# Patient Record
Sex: Female | Born: 1980
Health system: Southern US, Community
[De-identification: ages and names within clinical notes are randomized; demographics above are authoritative.]

## PROBLEM LIST (undated history)

## (undated) DIAGNOSIS — N920 Excessive and frequent menstruation with regular cycle: Secondary | ICD-10-CM

## (undated) DIAGNOSIS — R011 Cardiac murmur, unspecified: Secondary | ICD-10-CM

## (undated) DIAGNOSIS — I10 Essential (primary) hypertension: Secondary | ICD-10-CM

## (undated) DIAGNOSIS — N946 Dysmenorrhea, unspecified: Secondary | ICD-10-CM

## (undated) DIAGNOSIS — R894 Abnormal immunological findings in specimens from other organs, systems and tissues: Secondary | ICD-10-CM

## (undated) DIAGNOSIS — B009 Herpesviral infection, unspecified: Secondary | ICD-10-CM

## (undated) DIAGNOSIS — N9089 Other specified noninflammatory disorders of vulva and perineum: Secondary | ICD-10-CM

## (undated) DIAGNOSIS — N760 Acute vaginitis: Secondary | ICD-10-CM

## (undated) DIAGNOSIS — B9689 Other specified bacterial agents as the cause of diseases classified elsewhere: Secondary | ICD-10-CM

## (undated) DIAGNOSIS — N898 Other specified noninflammatory disorders of vagina: Secondary | ICD-10-CM

## (undated) HISTORY — DX: Herpesviral infection, unspecified: B00.9

## (undated) HISTORY — DX: Cardiac murmur, unspecified: R01.1

## (undated) HISTORY — DX: Acute vaginitis: N76.0

## (undated) HISTORY — DX: Excessive and frequent menstruation with regular cycle: N92.0

## (undated) HISTORY — DX: Other specified noninflammatory disorders of vagina: N89.8

## (undated) HISTORY — DX: Dysmenorrhea, unspecified: N94.6

## (undated) HISTORY — DX: Essential (primary) hypertension: I10

## (undated) HISTORY — DX: Other specified noninflammatory disorders of vulva and perineum: N90.89

## (undated) HISTORY — DX: Other specified bacterial agents as the cause of diseases classified elsewhere: B96.89

## (undated) HISTORY — DX: Abnormal immunological findings in specimens from other organs, systems and tissues: R89.4

---

## 2000-08-29 ENCOUNTER — Emergency Department (HOSPITAL_COMMUNITY): Admission: EM | Admit: 2000-08-29 | Discharge: 2000-08-29 | Payer: Self-pay | Admitting: Emergency Medicine

## 2000-08-31 ENCOUNTER — Emergency Department (HOSPITAL_COMMUNITY): Admission: EM | Admit: 2000-08-31 | Discharge: 2000-08-31 | Payer: Self-pay | Admitting: *Deleted

## 2001-03-06 ENCOUNTER — Emergency Department (HOSPITAL_COMMUNITY): Admission: EM | Admit: 2001-03-06 | Discharge: 2001-03-06 | Payer: Self-pay | Admitting: Emergency Medicine

## 2001-10-24 ENCOUNTER — Emergency Department (HOSPITAL_COMMUNITY): Admission: EM | Admit: 2001-10-24 | Discharge: 2001-10-24 | Payer: Self-pay | Admitting: Emergency Medicine

## 2001-10-24 ENCOUNTER — Encounter: Payer: Self-pay | Admitting: Emergency Medicine

## 2001-12-04 ENCOUNTER — Emergency Department (HOSPITAL_COMMUNITY): Admission: EM | Admit: 2001-12-04 | Discharge: 2001-12-04 | Payer: Self-pay | Admitting: Emergency Medicine

## 2002-05-13 ENCOUNTER — Emergency Department (HOSPITAL_COMMUNITY): Admission: EM | Admit: 2002-05-13 | Discharge: 2002-05-13 | Payer: Self-pay | Admitting: Emergency Medicine

## 2002-07-27 ENCOUNTER — Emergency Department (HOSPITAL_COMMUNITY): Admission: EM | Admit: 2002-07-27 | Discharge: 2002-07-27 | Payer: Self-pay | Admitting: Emergency Medicine

## 2004-09-03 ENCOUNTER — Emergency Department (HOSPITAL_COMMUNITY): Admission: EM | Admit: 2004-09-03 | Discharge: 2004-09-03 | Payer: Self-pay | Admitting: Emergency Medicine

## 2005-03-20 ENCOUNTER — Ambulatory Visit (HOSPITAL_COMMUNITY): Admission: RE | Admit: 2005-03-20 | Discharge: 2005-03-20 | Payer: Self-pay | Admitting: Obstetrics and Gynecology

## 2005-03-27 ENCOUNTER — Ambulatory Visit (HOSPITAL_COMMUNITY): Admission: RE | Admit: 2005-03-27 | Discharge: 2005-03-27 | Payer: Self-pay | Admitting: Obstetrics and Gynecology

## 2005-04-01 ENCOUNTER — Ambulatory Visit (HOSPITAL_COMMUNITY): Admission: AD | Admit: 2005-04-01 | Discharge: 2005-04-02 | Payer: Self-pay | Admitting: Obstetrics and Gynecology

## 2005-04-10 ENCOUNTER — Ambulatory Visit (HOSPITAL_COMMUNITY): Admission: RE | Admit: 2005-04-10 | Discharge: 2005-04-10 | Payer: Self-pay | Admitting: Obstetrics and Gynecology

## 2005-04-11 ENCOUNTER — Encounter: Payer: Self-pay | Admitting: Obstetrics & Gynecology

## 2005-04-11 ENCOUNTER — Inpatient Hospital Stay (HOSPITAL_COMMUNITY): Admission: AD | Admit: 2005-04-11 | Discharge: 2005-04-14 | Payer: Self-pay | Admitting: Obstetrics and Gynecology

## 2005-07-12 ENCOUNTER — Emergency Department (HOSPITAL_COMMUNITY): Admission: EM | Admit: 2005-07-12 | Discharge: 2005-07-12 | Payer: Self-pay | Admitting: Emergency Medicine

## 2005-11-26 ENCOUNTER — Ambulatory Visit: Payer: Self-pay | Admitting: Internal Medicine

## 2005-12-03 ENCOUNTER — Ambulatory Visit (HOSPITAL_COMMUNITY): Admission: RE | Admit: 2005-12-03 | Discharge: 2005-12-03 | Payer: Self-pay | Admitting: Internal Medicine

## 2005-12-03 ENCOUNTER — Ambulatory Visit: Payer: Self-pay | Admitting: *Deleted

## 2005-12-10 ENCOUNTER — Ambulatory Visit: Payer: Self-pay | Admitting: Internal Medicine

## 2005-12-10 LAB — CONVERTED CEMR LAB
Calcium: 9.1 mg/dL
Chloride: 106 meq/L
Creatinine, Ser: 0.86 mg/dL

## 2005-12-14 ENCOUNTER — Ambulatory Visit (HOSPITAL_COMMUNITY): Admission: RE | Admit: 2005-12-14 | Discharge: 2005-12-14 | Payer: Self-pay | Admitting: Internal Medicine

## 2006-04-14 ENCOUNTER — Ambulatory Visit: Payer: Self-pay | Admitting: Internal Medicine

## 2006-04-14 ENCOUNTER — Encounter (INDEPENDENT_AMBULATORY_CARE_PROVIDER_SITE_OTHER): Payer: Self-pay | Admitting: *Deleted

## 2006-04-14 ENCOUNTER — Other Ambulatory Visit: Admission: RE | Admit: 2006-04-14 | Discharge: 2006-04-14 | Payer: Self-pay | Admitting: Internal Medicine

## 2006-04-14 LAB — CONVERTED CEMR LAB: Pap Smear: NORMAL

## 2006-05-14 ENCOUNTER — Encounter: Payer: Self-pay | Admitting: Internal Medicine

## 2006-05-14 DIAGNOSIS — N6019 Diffuse cystic mastopathy of unspecified breast: Secondary | ICD-10-CM | POA: Insufficient documentation

## 2006-11-30 ENCOUNTER — Encounter: Payer: Self-pay | Admitting: Internal Medicine

## 2006-11-30 ENCOUNTER — Telehealth (INDEPENDENT_AMBULATORY_CARE_PROVIDER_SITE_OTHER): Payer: Self-pay | Admitting: *Deleted

## 2006-11-30 ENCOUNTER — Ambulatory Visit: Payer: Self-pay | Admitting: Internal Medicine

## 2006-11-30 DIAGNOSIS — R51 Headache: Secondary | ICD-10-CM | POA: Insufficient documentation

## 2006-11-30 DIAGNOSIS — N912 Amenorrhea, unspecified: Secondary | ICD-10-CM | POA: Insufficient documentation

## 2006-11-30 DIAGNOSIS — R030 Elevated blood-pressure reading, without diagnosis of hypertension: Secondary | ICD-10-CM | POA: Insufficient documentation

## 2006-11-30 DIAGNOSIS — R109 Unspecified abdominal pain: Secondary | ICD-10-CM | POA: Insufficient documentation

## 2006-11-30 DIAGNOSIS — R519 Headache, unspecified: Secondary | ICD-10-CM | POA: Insufficient documentation

## 2006-11-30 LAB — CONVERTED CEMR LAB
Beta hcg, urine, semiquantitative: NEGATIVE
Glucose, Urine, Semiquant: NEGATIVE
Specific Gravity, Urine: >=1.03
pH: 6

## 2006-12-01 ENCOUNTER — Telehealth (INDEPENDENT_AMBULATORY_CARE_PROVIDER_SITE_OTHER): Payer: Self-pay | Admitting: *Deleted

## 2006-12-01 LAB — CONVERTED CEMR LAB
Calcium: 8.8 mg/dL (ref 8.4–10.5)
Creatinine, Ser: 0.8 mg/dL (ref 0.40–1.20)

## 2006-12-09 ENCOUNTER — Telehealth (INDEPENDENT_AMBULATORY_CARE_PROVIDER_SITE_OTHER): Payer: Self-pay | Admitting: *Deleted

## 2006-12-09 ENCOUNTER — Ambulatory Visit (HOSPITAL_COMMUNITY): Admission: RE | Admit: 2006-12-09 | Discharge: 2006-12-09 | Payer: Self-pay | Admitting: Internal Medicine

## 2007-03-05 ENCOUNTER — Emergency Department (HOSPITAL_COMMUNITY): Admission: EM | Admit: 2007-03-05 | Discharge: 2007-03-05 | Payer: Self-pay | Admitting: Emergency Medicine

## 2007-03-10 ENCOUNTER — Ambulatory Visit: Payer: Self-pay | Admitting: Internal Medicine

## 2007-06-07 ENCOUNTER — Other Ambulatory Visit: Admission: RE | Admit: 2007-06-07 | Discharge: 2007-06-07 | Payer: Self-pay | Admitting: Internal Medicine

## 2007-06-07 ENCOUNTER — Encounter (INDEPENDENT_AMBULATORY_CARE_PROVIDER_SITE_OTHER): Payer: Self-pay | Admitting: Internal Medicine

## 2007-06-07 ENCOUNTER — Ambulatory Visit: Payer: Self-pay | Admitting: Internal Medicine

## 2007-06-09 ENCOUNTER — Telehealth (INDEPENDENT_AMBULATORY_CARE_PROVIDER_SITE_OTHER): Payer: Self-pay | Admitting: *Deleted

## 2007-07-20 ENCOUNTER — Emergency Department (HOSPITAL_COMMUNITY): Admission: EM | Admit: 2007-07-20 | Discharge: 2007-07-20 | Payer: Self-pay | Admitting: Emergency Medicine

## 2007-12-01 ENCOUNTER — Ambulatory Visit: Payer: Self-pay | Admitting: Internal Medicine

## 2007-12-12 ENCOUNTER — Encounter (INDEPENDENT_AMBULATORY_CARE_PROVIDER_SITE_OTHER): Payer: Self-pay | Admitting: Internal Medicine

## 2008-02-04 ENCOUNTER — Emergency Department (HOSPITAL_COMMUNITY): Admission: EM | Admit: 2008-02-04 | Discharge: 2008-02-04 | Payer: Self-pay | Admitting: Emergency Medicine

## 2008-02-27 ENCOUNTER — Ambulatory Visit: Payer: Self-pay | Admitting: Internal Medicine

## 2008-05-15 ENCOUNTER — Ambulatory Visit: Payer: Self-pay | Admitting: Internal Medicine

## 2008-05-15 ENCOUNTER — Other Ambulatory Visit: Admission: RE | Admit: 2008-05-15 | Discharge: 2008-05-15 | Payer: Self-pay | Admitting: Internal Medicine

## 2008-05-15 ENCOUNTER — Encounter (INDEPENDENT_AMBULATORY_CARE_PROVIDER_SITE_OTHER): Payer: Self-pay | Admitting: Internal Medicine

## 2008-05-15 DIAGNOSIS — J039 Acute tonsillitis, unspecified: Secondary | ICD-10-CM | POA: Insufficient documentation

## 2008-05-17 ENCOUNTER — Encounter (INDEPENDENT_AMBULATORY_CARE_PROVIDER_SITE_OTHER): Payer: Self-pay | Admitting: Internal Medicine

## 2008-12-20 ENCOUNTER — Emergency Department (HOSPITAL_COMMUNITY): Admission: EM | Admit: 2008-12-20 | Discharge: 2008-12-20 | Payer: Self-pay | Admitting: Emergency Medicine

## 2009-05-10 HISTORY — PX: TUBAL LIGATION: SHX77

## 2009-07-22 ENCOUNTER — Encounter (INDEPENDENT_AMBULATORY_CARE_PROVIDER_SITE_OTHER): Payer: Self-pay | Admitting: *Deleted

## 2009-08-07 ENCOUNTER — Emergency Department (HOSPITAL_COMMUNITY): Admission: EM | Admit: 2009-08-07 | Discharge: 2009-08-07 | Payer: Self-pay | Admitting: Emergency Medicine

## 2010-04-17 ENCOUNTER — Emergency Department (HOSPITAL_COMMUNITY)
Admission: EM | Admit: 2010-04-17 | Discharge: 2010-04-17 | Payer: Self-pay | Source: Home / Self Care | Admitting: Emergency Medicine

## 2010-05-10 HISTORY — PX: HERNIA REPAIR: SHX51

## 2010-06-09 NOTE — Letter (Signed)
Summary: 1st Missed Appt.  [facility]  [address]   [place], [place] [address]   Phone: [phone]  Fax: [phone]    July 22, 2009  MRN: [id number]  ALIANA KREISCHER [address] [place], [place]  [address]  Dear Ms. Ernestina Penna,  At [facility], we make every attempt to fit patients into our schedule by reserving several appointment slots for same-day appointments.  However, we cannot always make appointments for patients the same day they are calling.  At the end of the day, we look back at our schedule and find that because of last-minute cancellations and patients not showing up for their scheduled appointments, we have several appointment slots that are left open and could have been used by another person who really needed it.  In the past, you may have been one of the patients who could not get in when you needed to.  But recently, you were one of the patients with an appointment that you didn't show up for or canceled too late for [place] to fill it.  We choose not to charge no-show or last minute cancellation fees to our patients, like many other offices do.  We do not wish to institute that policy and hope we never have to.  However, we kindly request that you assist [place] by providing at least 24 hours' notice if you can't make your appointment.  If no-shows or late cancellations become habitual (i.e. Three or more in a one-year period), we may terminate the physician-patient relationship.    Thank you for your consideration and cooperation.   Altamease Oiler

## 2010-06-11 ENCOUNTER — Other Ambulatory Visit: Payer: Self-pay | Admitting: Obstetrics & Gynecology

## 2010-06-11 ENCOUNTER — Other Ambulatory Visit (HOSPITAL_COMMUNITY)
Admission: RE | Admit: 2010-06-11 | Discharge: 2010-06-11 | Disposition: A | Discharge status: Home / Self Care | Payer: Self-pay | Source: Ambulatory Visit | Attending: Obstetrics and Gynecology | Admitting: Obstetrics and Gynecology

## 2010-06-11 DIAGNOSIS — Z01419 Encounter for gynecological examination (general) (routine) without abnormal findings: Secondary | ICD-10-CM | POA: Insufficient documentation

## 2010-07-31 LAB — RAPID STREP SCREEN (MED CTR MEBANE ONLY): Streptococcus, Group A Screen (Direct): NEGATIVE

## 2010-09-03 ENCOUNTER — Other Ambulatory Visit: Payer: Self-pay | Admitting: General Surgery

## 2010-09-03 ENCOUNTER — Encounter (HOSPITAL_COMMUNITY): Payer: 59

## 2010-09-03 LAB — BASIC METABOLIC PANEL
BUN: 12 mg/dL (ref 6–23)
Calcium: 9.1 mg/dL (ref 8.4–10.5)
GFR calc non Af Amer: >60 mL/min (ref >60)
Potassium: 4 mEq/L (ref 3.5–5.1)
Sodium: 136 mEq/L (ref 135–145)

## 2010-09-03 LAB — CBC
MCV: 84.6 fL (ref 78.0–100.0)
Platelets: 327 10*3/uL (ref 150–400)
RDW: 13.3 % (ref 11.5–15.5)
WBC: 7 10*3/uL (ref 4.0–10.5)

## 2010-09-03 LAB — SURGICAL PCR SCREEN: Staphylococcus aureus: POSITIVE — AB

## 2010-09-08 HISTORY — PX: LAPAROSCOPIC INCISIONAL / UMBILICAL / VENTRAL HERNIA REPAIR: SUR789

## 2010-09-09 ENCOUNTER — Ambulatory Visit (HOSPITAL_COMMUNITY)
Admission: RE | Admit: 2010-09-09 | Discharge: 2010-09-09 | Disposition: A | Discharge status: Home / Self Care | Payer: 59 | Source: Ambulatory Visit | Attending: General Surgery | Admitting: General Surgery

## 2010-09-09 DIAGNOSIS — K439 Ventral hernia without obstruction or gangrene: Secondary | ICD-10-CM | POA: Insufficient documentation

## 2010-09-09 DIAGNOSIS — Z01812 Encounter for preprocedural laboratory examination: Secondary | ICD-10-CM | POA: Insufficient documentation

## 2010-09-09 NOTE — Op Note (Signed)
  [name], Terry NO.:  [id number]  MEDICAL RECORD NO.:  [id number]           PATIENT TYPE:  O  LOCATION:  DAYP                          FACILITY:  [facility]  PHYSICIAN:  Dalia Heading, M.D.  DATE OF BIRTH:  1980/07/18  DATE OF PROCEDURE:  09/09/2010 DATE OF DISCHARGE:                              OPERATIVE REPORT   PREOPERATIVE DIAGNOSIS:  Ventral hernia.  POSTOPERATIVE DIAGNOSIS:  Ventral hernia.  PROCEDURE:  Laparoscopic ventral herniorrhaphy with mesh.  SURGEON:  Dalia Heading, MD  ANESTHESIA:  General endotracheal.  INDICATIONS:  The patient is a 30 year old black female who presents with a ventral hernia.  The risks and benefits of the procedure including bleeding, infection, and the possibility of an open procedure were fully explained to the patient, gave informed consent.  PROCEDURE NOTE:  The patient was placed in the supine position.  After induction of general endotracheal anesthesia, the abdomen was prepped and draped using the usual sterile technique with DuraPrep.  Surgical site confirmation was performed.  A Veress needle was inserted in the epigastric region and confirmation of placement to the abdomen was using the saline drop test.  The abdomen was then insufflated to 15 mmHg pressure.  An 11-mm trocar was then introduced into the left upper quadrant under direct visualization without difficulty.  An additional 11-mm trocar was placed in the right upper quadrant region and 5-mm trocars placed in the right lower quadrant and left lower quadrant regions.  The hernia defect was noted to be approximately 5-6 cm in its greatest diameter in the periumbilical region.  A 15 x 20 cm Ethicon physio mesh was then inserted and tacked in 4 quadrants to the abdominal wall using 2-0 Prolene sutures.  A Protacker was then used as well as an absorbable tacker to complete the fixation of the mesh against the abdominal wall.  The abdomen  was inspected and there was no evidence of perforation or bleeding of the bowel or mesentery.  All air was then evacuated from the abdominal cavity prior to the removal of the trocars.  All wounds were irrigated with normal saline.  All wounds were injected with 0.5% Sensorcaine.  All incisions were closed using staples. Betadine ointment and dry sterile dressings were applied.  All tape and needle counts were correct at the end of the procedure. The patient was extubated in the operating room and went back to recovery room awake in stable condition.  COMPLICATIONS:  None.  SPECIMEN:  None.  BLOOD LOSS:  Minimal.     Dalia Heading, M.D.     MAJ/MEDQ  D:  09/09/2010  T:  09/09/2010  Job:  [id number]  cc:   Lorin Picket A. Gerda Diss, MD Fax: [phone]  Lazaro Arms, M.D. Fax: [phone]  Electronically Signed by Franky Macho M.D. on 09/09/2010 02:29:09 PM

## 2010-09-25 NOTE — Discharge Summary (Signed)
[name], [name]               ACCOUNT NO.:  [id number]   MEDICAL RECORD NO.:  [id number]          PATIENT TYPE:  INP   LOCATION:  A401                          FACILITY:  [facility]   PHYSICIAN:  Tilda Burrow, M.D. DATE OF BIRTH:  1980-06-07   DATE OF ADMISSION:  04/11/2005  DATE OF DISCHARGE:  12/06/2006LH                                 DISCHARGE SUMMARY   ADMISSION DIAGNOSIS:  Pregnancy 40 weeks, 2 days, early labor.   DISCHARGE DIAGNOSES:  1.  Pregnancy 40 weeks, 2 days, delivered.  2.  Cephalopelvic disproportion.   PROCEDURES:  1.  Labor epidural by Dr. Turner Daniels.  2.  Primary low transverse cervical cesarean section by Dr. Turner Daniels.   Both performed on the day of admission, April 11, 2005.   DISCHARGE MEDICATIONS:  1.  Tylox one q.6 h p.r.n. pain.  2.  Motrin 800 mg one p.o. q.8 h routinely x1 week.  3.  Loestrin FE 24 one tablet daily beginning two weeks.  4.  Bottle feeding.   FOLLOWUP:  Follow up in five days with [facility].   HOSPITAL SUMMARY:  A 30 year old gravida 1, para 0 at 40 weeks 2 days, seen  on the morning of April 11, 2005, in early labor with cervix 3 cm dilated  with no prenatal problems noted.  Fifteen prenatal visits documented.  She  had prior medical history positive for HSV 2, suppressed during the third  trimester with no evidence of lesions.  Blood type was A positive,  hemoglobin 13, hematocrit 39, hepatitis HIV, GC/Chlamydia all negative, RPR  nonreactive x2.  Two evaluation.  MSFP at less than 1:500.  Risk of Downs  syndrome was 1:500.  Glucose tolerance test 113 mg/dL.   HOSPITAL COURSE:  The patient was admitted under the care of Dr. Turner Daniels,  managed through the day, allowed to labor.  She received Pitocin to augment  the labor, but had arrested labor in the evening hours of April 11, 2005,  resulting in primary decision for surgery.  She had primary low transverse  cervical cesarean section under epidural with 750  cc blood loss.  She  delivered a healthy female infant, Apgars 9 and 9.  Postpartum course was  uneventful with postop hemoglobin 10, hematocrit 29, compared to admitting  hemoglobin 13.4, hematocrit 40.3.  White count at discharge was 12,400.  The  patient was tolerating a regular diet with passage of flatus.  Healthy  incision with oral contraceptives planned.   ADDENDUM:  The patient plans to pump and bottle feed the baby with breast  milk.  If she does persist in pumping greater than two weeks, she will delay  the initiation of birth control pills.      Tilda Burrow, M.D.  Electronically Signed     JVF/MEDQ  D:  04/14/2005  T:  04/14/2005  Job:  [id number]

## 2010-09-25 NOTE — Procedures (Signed)
[name], [name]               ACCOUNT NO.:  [id number]   MEDICAL RECORD NO.:  [id number]          PATIENT TYPE:  OUT   LOCATION:  RAD                           FACILITY:  [facility]   PHYSICIAN:  Vida Roller, M.D.   DATE OF BIRTH:  [birth year]   DATE OF PROCEDURE:  12/03/2005  DATE OF DISCHARGE:                                  ECHOCARDIOGRAM   Tape number:  [id number].  Tape count:  [id number].   PRIMARY CARE PHYSICIAN:  Erle Crocker, M.D.   This is a 30 year old woman for LV systolic function.   Technical quality study is adequate.   M-MODE TRACINGS:  The aorta 30 is mm.   Left atrium is 32 mm.   The septum is 12 mm.   The posterior wall is 10 mm.   Left ventricular diastolic dimension is 52 mm.   Left ventricular systolic dimension 37 mm.   2-D AND DOPPLER IMAGING:  The left ventricle is normal sized, and there is  preserved LV systolic function with an ejection fraction of 60-65%.  No wall  motion abnormalities were seen.   The right ventricle is top normal in size with normal systolic function.   Both atria appear to be normal size.   The aortic valve is morphologically unremarkable with no stenosis or  regurgitation.   The mitral valve is morphologically unremarkable with no stenosis or  regurgitation.   The tricuspid valve has trivial regurgitation.   There is no pericardial effusion.   The inferior vena cava was not well seen.      Vida Roller, M.D.  Electronically Signed     JH/MEDQ  D:  12/03/2005  T:  12/03/2005  Job:  [id number]

## 2010-09-25 NOTE — Op Note (Signed)
NAME:  [name], [name]               ACCOUNT NO.:  [id number]   MEDICAL RECORD NO.:  [id number]          PATIENT TYPE:  INP   LOCATION:  A401                          FACILITY:  [facility]   PHYSICIAN:  Lazaro Arms, M.D.        DATE OF BIRTH:   DATE OF PROCEDURE:  DATE OF DISCHARGE:                                 OPERATIVE REPORT   PREOPERATIVE DIAGNOSES:  1.  Intrauterine pregnancy at 40 and 2/[redacted] weeks gestation.  2.  Secondary arrest of dilatation of descent.   POSTOPERATIVE DIAGNOSES:  1.  Intrauterine pregnancy at 40 and 2/[redacted] weeks gestation.  2.  Secondary arrest of dilatation of descent.   PROCEDURE:  Primary low transverse cesarean section.   SURGEON:  Lazaro Arms, M.D.   ANESTHESIA:  Epidural.   FINDINGS:  Over a low transverse hysterotomy incision delivered a viable  female.  Apgars 9 and 9. Weight determined in the nursery.  A 3-vessel cord.  Cord blood and cord gas sent.  There was a nuchal cord, a body cord, and a  leg cord.   DESCRIPTION OF PROCEDURE:  The patient had an epidural in place.  It was  dosed up to adequate surgical anesthetic level.  She was prepped and draped  in the usual sterile fashion.  A Pfannenstiel skin incision was made,  carried down sharply through the rectus fascia, scored in the midline, and  extended laterally.  The fascia was taken off the muscles superiorly and  inferiorly without difficulty.  The muscles were divided.  The peritoneal  cavity was entered, back blade was placed.  The vesicouterine serosal flap  was created. A low transverse hysterotomy incision was made.  Over this  incision was delivered a viable female infant with Apgars of 9 and 9 with  the weight to be determined in the nursery.  Again, there was a loose  nuchal, a body cord, and a left leg cord.   The infant was handed to Dr. Milford Cage who was in attendance for routine neonatal  resuscitation.  The placenta was delivered spontaneously.  Cord blood and  cord gas sent.   The uterus exteriorized, closed in two layers.  The first  being a running interlocking layer and the second being an imbricating  layer. There was good hemostasis.  The uterus was replaced in the peritoneal  cavity.  The pelvis was irrigated vigorously.  The muscles and peritoneum  reapproximated loosely.  The fascia closed using #0 Vicryl running.  Subcutaneous tissue was made hemostatic and irrigated.  The skin was closed  using skin staples.  The patient tolerated the procedure well.  She  experienced 750 mL of blood loss; taken to the recovery room in good stable  condition.  All counts were correct x3.      Lazaro Arms, M.D.  Electronically Signed     LHE/MEDQ  D:  04/11/2005  T:  04/12/2005  Job:  [id number]

## 2010-09-25 NOTE — H&P (Signed)
[name], [name]               ACCOUNT NO.:  [id number]   MEDICAL RECORD NO.:  [id number]          PATIENT TYPE:  OIB   LOCATION:  LDR4                          FACILITY:  [facility]   PHYSICIAN:  Tilda Burrow, M.D. DATE OF BIRTH:  1980/06/24   DATE OF ADMISSION:  04/01/2005  DATE OF DISCHARGE:  LH                                HISTORY & PHYSICAL   OBSERVATION NOTE:  Rule out labor.   A 30 year old, primiparous, female at 38-1/[redacted] weeks gestation complaining of  abdominal pain to rule out contractions. There has been no bleeding and no  gush of fluid. Perineal moisture has remained unchanged. The baby has been  active. She had a normal day. She was able to enjoy Thanksgiving day before  arriving here with abdominal discomfort to rule out labor.   Cervical exam shows the cervix by nursing evaluation to be 1 cm long,  posterior with a vertex applied. Cervix was uneffaced. Fetal monitoring  shows excellent reactivity. There are no contractions noted at all.   IMPRESSION:  No evidence of labor. Routine miseries of late pregnancy.   PLAN:  Discharge home. Ambien 10 milligrams at discharge. Tylox 1 tablet  p.o. at discharge. Follow up routine as scheduled.   ADDENDUM:  The patient states that she has discussed Zerita Boers, C.N.N.,  the possibility of induction on April 07, 2005 if undelivered. We will  evaluate cervical status for cervical favorability prior to deciding on date  of possible induction.      Tilda Burrow, M.D.  Electronically Signed     JVF/MEDQ  D:  04/01/2005  T:  04/01/2005  Job:  [id number]

## 2010-09-25 NOTE — Op Note (Signed)
NAMEMILAGROS, MIDDENDORF               ACCOUNT NO.:  [id number]   MEDICAL RECORD NO.:  [id number]          PATIENT TYPE:  INP   LOCATION:  A401                          FACILITY:  [facility]   PHYSICIAN:  Lazaro Arms, M.D.   DATE OF BIRTH:  02/20/1981   DATE OF PROCEDURE:  04/12/2005  DATE OF DISCHARGE:  04/14/2005                                 OPERATIVE REPORT   Rucha is a 30 year old gravida 1 at 40-2/7 weeks who presents to labor and  delivery in early labor. She wanted to try to do without any epidural but  then decided that she needed one. As a result, she was placed in the sitting  position. Betadine prep was used. One percent lidocaine was injected into  the L3-L4 interspace. The Tuohy needle was used and loss-of-resistance  technique  employed, and the epidural space was found with one pass without  difficulty. Ten cc of 0.125% bupivacaine plain was given without ill  effects. An additional 10 cc was given through the catheter which was fed 5  cm into the epidural space to dose up the epidural. The catheter was then  taped down. A continuous infusion of 0.125% bupivacaine with 2 mcg/cc of  fentanyl was begun at 12 cc an hour. The patient is getting good pain  relief. The fetal heart tracing is stable, and the blood pressure is stable.      Lazaro Arms, M.D.  Electronically Signed     LHE/MEDQ  D:  05/26/2005  T:  05/26/2005  Job:  [id number]

## 2010-10-05 NOTE — H&P (Signed)
  [name], Cruz NO.:  [id number]  MEDICAL RECORD NO.:  [id number]           PATIENT TYPE:  LOCATION:                                 FACILITY:  PHYSICIAN:  Dalia Heading, M.D.  DATE OF BIRTH:  January 28, 1981  DATE OF ADMISSION: DATE OF DISCHARGE:  LH                             HISTORY & PHYSICAL   CHIEF COMPLAINT:  Ventral hernia.  HISTORY OF PRESENT ILLNESS:  The patient is a 30 year old black female who is referred for evaluation and treatment of a ventral hernia.  It has been present for some time, but is increasing in size and is causing her discomfort.  It is made worse with straining.  PAST MEDICAL HISTORY:  Unremarkable.  PAST SURGICAL HISTORY:  C-section.  CURRENT MEDICATIONS:  None.  ALLERGIES:  No known drug allergies.  REVIEW OF SYSTEMS:  The patient denies drinking or smoking.  She denies any other cardiopulmonary difficulties or bleeding disorders.  PHYSICAL EXAMINATION:  GENERAL:  The patient is a well-developed, well- nourished black female in no acute distress. HEENT:  Unremarkable. LUNGS:  Clear to auscultation with equal breath sounds bilaterally. HEART:  Regular rate and rhythm without S3, S4, or murmurs. ABDOMEN:  Soft, nontender, and nondistended.  No hepatosplenomegaly or masses noted.  The patient has both a supraumbilical hernia which is 4 cm in size and a small umbilical hernia.  IMPRESSION:  Ventral hernia.  PLAN:  The patient is scheduled for a laparoscopic ventral herniorrhaphy with mesh on Sep 09, 2010.  The risks and benefits of the procedure including bleeding, infection, pain, and the possibility of recurrence of the hernia were fully explained to the patient, gave informed consent.     Dalia Heading, M.D.     MAJ/MEDQ  D:  08/19/2010  T:  08/20/2010  Job:  [id number]  cc:   Lorin Picket A. Gerda Diss, MD Fax: [phone]  Lazaro Arms, M.D. Fax: [phone]  Short-Stay Unit  Electronically Signed by Franky Macho M.D. on 10/05/2010 08:37:47 PM

## 2011-01-19 ENCOUNTER — Other Ambulatory Visit (HOSPITAL_COMMUNITY): Payer: Self-pay | Admitting: General Surgery

## 2011-01-22 ENCOUNTER — Other Ambulatory Visit (HOSPITAL_COMMUNITY): Payer: Self-pay | Admitting: General Surgery

## 2011-01-22 ENCOUNTER — Ambulatory Visit (HOSPITAL_COMMUNITY)
Admission: RE | Admit: 2011-01-22 | Discharge: 2011-01-22 | Disposition: A | Discharge status: Home / Self Care | Payer: 59 | Source: Ambulatory Visit | Attending: General Surgery | Admitting: General Surgery

## 2011-01-22 DIAGNOSIS — Z9889 Other specified postprocedural states: Secondary | ICD-10-CM | POA: Insufficient documentation

## 2011-01-22 DIAGNOSIS — N200 Calculus of kidney: Secondary | ICD-10-CM | POA: Insufficient documentation

## 2011-01-22 DIAGNOSIS — R109 Unspecified abdominal pain: Secondary | ICD-10-CM | POA: Insufficient documentation

## 2011-02-17 LAB — STREP A DNA PROBE

## 2011-02-17 LAB — MONONUCLEOSIS SCREEN: Mono Screen: NEGATIVE

## 2011-05-07 ENCOUNTER — Encounter: Payer: Self-pay | Admitting: Emergency Medicine

## 2011-05-07 ENCOUNTER — Emergency Department (HOSPITAL_COMMUNITY): Payer: 59

## 2011-05-07 ENCOUNTER — Emergency Department (HOSPITAL_COMMUNITY)
Admission: EM | Admit: 2011-05-07 | Discharge: 2011-05-07 | Disposition: A | Discharge status: Home / Self Care | Payer: 59 | Attending: Emergency Medicine | Admitting: Emergency Medicine

## 2011-05-07 DIAGNOSIS — S335XXA Sprain of ligaments of lumbar spine, initial encounter: Secondary | ICD-10-CM | POA: Insufficient documentation

## 2011-05-07 DIAGNOSIS — X58XXXA Exposure to other specified factors, initial encounter: Secondary | ICD-10-CM | POA: Insufficient documentation

## 2011-05-07 DIAGNOSIS — M545 Low back pain, unspecified: Secondary | ICD-10-CM | POA: Insufficient documentation

## 2011-05-07 DIAGNOSIS — S39012A Strain of muscle, fascia and tendon of lower back, initial encounter: Secondary | ICD-10-CM

## 2011-05-07 DIAGNOSIS — M539 Dorsopathy, unspecified: Secondary | ICD-10-CM | POA: Insufficient documentation

## 2011-05-07 LAB — URINALYSIS, ROUTINE W REFLEX MICROSCOPIC
Glucose, UA: NEGATIVE mg/dL
Hgb urine dipstick: NEGATIVE
Specific Gravity, Urine: 1.02 (ref 1.005–1.030)

## 2011-05-07 MED ORDER — CYCLOBENZAPRINE HCL 10 MG PO TABS
10.0000 mg | ORAL_TABLET | Freq: Once | ORAL | Status: AC
  Administered 2011-05-07: 10 mg via ORAL
  Filled 2011-05-07: qty 1

## 2011-05-07 MED ORDER — PREDNISONE 20 MG PO TABS
60.0000 mg | ORAL_TABLET | Freq: Once | ORAL | Status: AC
  Administered 2011-05-07: 60 mg via ORAL
  Filled 2011-05-07: qty 3

## 2011-05-07 MED ORDER — HYDROCODONE-ACETAMINOPHEN 5-325 MG PO TABS: 1.0000 | ORAL_TABLET | Freq: Four times a day (QID) | ORAL | Status: AC | PRN

## 2011-05-07 MED ORDER — PREDNISONE 50 MG PO TABS: ORAL_TABLET | ORAL | Status: AC

## 2011-05-07 MED ORDER — CYCLOBENZAPRINE HCL 10 MG PO TABS: ORAL_TABLET | ORAL | Status: DC

## 2011-05-07 MED ORDER — HYDROCODONE-ACETAMINOPHEN 5-325 MG PO TABS
1.0000 | ORAL_TABLET | Freq: Once | ORAL | Status: AC
  Administered 2011-05-07: 1 via ORAL
  Filled 2011-05-07: qty 1

## 2011-05-07 NOTE — ED Notes (Signed)
Pt c/o low back pain since yesterday.

## 2011-05-07 NOTE — ED Provider Notes (Signed)
History     CSN: [id number]  Arrival date & time 05/07/11  4696   First MD Initiated Contact with Patient 05/07/11 1045      Chief Complaint  Patient presents with  . Back Pain    (Consider location/radiation/quality/duration/timing/severity/associated sxs/prior treatment) HPI Comments: Lower lumbar back pain started yest.  Has taken ibuprofen 800 mg x 2 and hydrocodone x 1 with no relief.  No UTI sxs.  Just finished menstrual cycle.  Patient is a 30 y.o. female presenting with back pain. The history is provided by the patient. No language interpreter was used.  Back Pain  This is a new problem. The current episode started yesterday. The problem occurs constantly. The problem has not changed since onset.The pain is associated with no known injury. The quality of the pain is described as aching. The pain does not radiate. The pain is moderate. The symptoms are aggravated by bending and twisting. The pain is the same all the time. Stiffness is present all day. Pertinent negatives include no fever, no dysuria and no pelvic pain. She has tried NSAIDs for the symptoms. The treatment provided no relief.    History reviewed. No pertinent past medical history.  Past Surgical History  Procedure Date  . Cesarean section   . Hernia repair     No family history on file.  History  Substance Use Topics  . Smoking status: Never Smoker   . Smokeless tobacco: Not on file  . Alcohol Use: No    OB History    Grav Para Term Preterm Abortions TAB SAB Ect Mult Living                  Review of Systems  Constitutional: Negative for fever and chills.  HENT: Negative for hearing loss.   Genitourinary: Negative for dysuria, urgency, frequency, flank pain, vaginal bleeding, vaginal discharge, menstrual problem and pelvic pain.  Musculoskeletal: Positive for back pain.  All other systems reviewed and are negative.    Allergies  Review of patient's allergies indicates no known  allergies.  Home Medications  No current outpatient prescriptions on file.  BP 117/79  Pulse 79  Temp 98.6 F (37 C)  Resp 20  Ht 5\' 6"  (1.676 m)  Wt 193 lb (87.544 kg)  BMI 31.15 kg/m2  SpO2 100%  LMP 04/29/2011  Physical Exam  Nursing note and vitals reviewed. Constitutional: She is oriented to person, place, and time. She appears well-developed and well-nourished. No distress.  HENT:  Head: Normocephalic and atraumatic.  Right Ear: External ear normal.  Left Ear: External ear normal.  Nose: Nose normal.  Mouth/Throat: No oropharyngeal exudate.  Eyes: EOM are normal.  Neck: Normal range of motion.  Cardiovascular: Normal rate, regular rhythm and normal heart sounds.   Pulmonary/Chest: Effort normal and breath sounds normal.  Abdominal: Soft. She exhibits no distension. There is no tenderness.  Musculoskeletal:       Lumbar back: She exhibits decreased range of motion, tenderness, bony tenderness, pain and abnormal pulse. She exhibits no swelling, no edema, no deformity, no laceration and no spasm.       Back:  Neurological: She is alert and oriented to person, place, and time. She displays normal reflexes. No cranial nerve deficit. Coordination normal.  Skin: Skin is warm and dry. She is not diaphoretic.  Psychiatric: She has a normal mood and affect. Judgment normal.    ED Course  Procedures (including critical care time)  Labs Reviewed - No data to  display No results found.   No diagnosis found.    MDM          Worthy Rancher, PA 05/07/11 1324

## 2011-05-08 NOTE — ED Provider Notes (Signed)
Medical screening examination/treatment/procedure(s) were performed by non-physician practitioner and as supervising physician I was immediately available for consultation/collaboration.  Donnetta Hutching, MD 05/08/11 1314

## 2011-06-14 ENCOUNTER — Other Ambulatory Visit: Payer: Self-pay | Admitting: Adult Health

## 2011-06-14 ENCOUNTER — Other Ambulatory Visit (HOSPITAL_COMMUNITY)
Admission: RE | Admit: 2011-06-14 | Discharge: 2011-06-14 | Disposition: A | Discharge status: Home / Self Care | Payer: 59 | Source: Ambulatory Visit | Attending: Obstetrics and Gynecology | Admitting: Obstetrics and Gynecology

## 2011-06-14 DIAGNOSIS — Z01419 Encounter for gynecological examination (general) (routine) without abnormal findings: Secondary | ICD-10-CM | POA: Insufficient documentation

## 2011-10-06 ENCOUNTER — Emergency Department (HOSPITAL_COMMUNITY)
Admission: EM | Admit: 2011-10-06 | Discharge: 2011-10-06 | Disposition: A | Discharge status: Home / Self Care | Payer: 59 | Attending: Emergency Medicine | Admitting: Emergency Medicine

## 2011-10-06 ENCOUNTER — Encounter (HOSPITAL_COMMUNITY): Payer: Self-pay | Admitting: Emergency Medicine

## 2011-10-06 ENCOUNTER — Emergency Department (HOSPITAL_COMMUNITY): Payer: 59

## 2011-10-06 DIAGNOSIS — K6289 Other specified diseases of anus and rectum: Secondary | ICD-10-CM | POA: Insufficient documentation

## 2011-10-06 DIAGNOSIS — R10819 Abdominal tenderness, unspecified site: Secondary | ICD-10-CM | POA: Insufficient documentation

## 2011-10-06 DIAGNOSIS — Z9889 Other specified postprocedural states: Secondary | ICD-10-CM | POA: Insufficient documentation

## 2011-10-06 DIAGNOSIS — R109 Unspecified abdominal pain: Secondary | ICD-10-CM | POA: Insufficient documentation

## 2011-10-06 LAB — URINALYSIS, ROUTINE W REFLEX MICROSCOPIC
Glucose, UA: NEGATIVE mg/dL
Leukocytes, UA: NEGATIVE
pH: 7.5 (ref 5.0–8.0)

## 2011-10-06 LAB — COMPREHENSIVE METABOLIC PANEL
ALT: 24 U/L (ref 0–35)
Albumin: 3.3 g/dL — ABNORMAL LOW (ref 3.5–5.2)
Alkaline Phosphatase: 58 U/L (ref 39–117)
BUN: 15 mg/dL (ref 6–23)
Chloride: 103 mEq/L (ref 96–112)
Glucose, Bld: 96 mg/dL (ref 70–99)
Potassium: 3.8 mEq/L (ref 3.5–5.1)
Total Bilirubin: 0.1 mg/dL — ABNORMAL LOW (ref 0.3–1.2)

## 2011-10-06 LAB — PREGNANCY, URINE: Preg Test, Ur: NEGATIVE

## 2011-10-06 LAB — DIFFERENTIAL
Basophils Relative: 0 % (ref 0–1)
Lymphs Abs: 2.2 10*3/uL (ref 0.7–4.0)
Monocytes Relative: 8 % (ref 3–12)
Neutro Abs: 2.4 10*3/uL (ref 1.7–7.7)
Neutrophils Relative %: 46 % (ref 43–77)

## 2011-10-06 LAB — CBC
Hemoglobin: 11.5 g/dL — ABNORMAL LOW (ref 12.0–15.0)
RBC: 4.22 MIL/uL (ref 3.87–5.11)

## 2011-10-06 LAB — LIPASE, BLOOD: Lipase: 27 U/L (ref 11–59)

## 2011-10-06 MED ORDER — HYDROCODONE-ACETAMINOPHEN 5-325 MG PO TABS: 1.0000 | ORAL_TABLET | ORAL | Status: AC | PRN

## 2011-10-06 MED ORDER — IOHEXOL 300 MG/ML  SOLN
100.0000 mL | Freq: Once | INTRAMUSCULAR | Status: AC | PRN
  Administered 2011-10-06: 100 mL via INTRAVENOUS

## 2011-10-06 MED ORDER — POLYETHYLENE GLYCOL 3350 17 GM/SCOOP PO POWD: 17.0000 g | Freq: Every day | ORAL | Status: AC

## 2011-10-06 NOTE — ED Notes (Signed)
Pt c/o mid abd pain and tenderness since "the beginning of the year."  Reports had surgery for hernia last May.  Denies n/v/d.  LBM was today and was normal per pt. Pt says has been eating and drinking.

## 2011-10-06 NOTE — ED Notes (Signed)
Radiology staff taking pt to ct.

## 2011-10-06 NOTE — ED Notes (Signed)
Pt c/o abd pain x a couple of months. Pt denies any n/v/d. Pt states she had a hernia repair a year ago. Pt also c/o rectal irritation with itching and bleeding.

## 2011-10-06 NOTE — Discharge Instructions (Signed)

## 2011-10-06 NOTE — ED Provider Notes (Signed)
History    Scribed for [employer]. Rubin Payor, MD, the patient was seen in room APA12/APA12. This chart was scribed by Katha Cabal.  CSN: [id number]  Arrival date & time 10/06/11  8295   First MD Initiated Contact with Patient 10/06/11 0815      Chief Complaint  Patient presents with  . Abdominal Pain    (Consider location/radiation/quality/duration/timing/severity/associated sxs/prior Treatment)    Patient was seen at 0817.    Patient is a 31 y.o. female presenting with abdominal pain. The history is provided by the patient. No language interpreter was used.  Abdominal Pain The primary symptoms of the illness include abdominal pain. The primary symptoms of the illness do not include nausea, vomiting or diarrhea. The current episode started more than 2 days ago. The problem has not changed since onset. The abdominal pain is located in the RUQ and LUQ. The abdominal pain does not radiate. The abdominal pain is relieved by nothing. The abdominal pain is exacerbated by eating.  The illness is associated with eating. Significant associated medical issues do not include inflammatory bowel disease.   Pain began "a while ago".  Pain described as a constant "tender" and pressure like pain.  Patient had hernia surgery a year ago. Patient states she feels bloated.  Reports abdomen never went down after surgery.  Pain gets worse after eating certain foods.  Denies fever, nausea, vomiting, diarrhea.  Patient reports rectal pain and blood after wiping for past two weeks.     History reviewed. No pertinent past medical history.  Past Surgical History  Procedure Date  . Cesarean section   . Hernia repair     History reviewed. No pertinent family history.  History  Substance Use Topics  . Smoking status: Current Everyday Smoker    Types: Cigarettes  . Smokeless tobacco: Not on file  . Alcohol Use: Yes     occas    OB History    Grav Para Term Preterm Abortions TAB SAB Ect Mult Living                    Review of Systems  Constitutional: Negative.   HENT: Negative.   Eyes: Negative.   Respiratory: Negative.   Cardiovascular: Negative.   Gastrointestinal: Positive for abdominal pain and rectal pain. Negative for nausea, vomiting and diarrhea.  Genitourinary: Negative.   Musculoskeletal: Negative.   Skin: Negative.   Neurological: Negative.   Hematological: Negative.   Psychiatric/Behavioral: Negative.     Allergies  Review of patient's allergies indicates no known allergies.  Home Medications   Current Outpatient Rx  Name Route Sig Dispense Refill  . ADULT MULTIVITAMIN W/MINERALS CH Oral Take 1 tablet by mouth daily.    Marland Kitchen NORGESTIMATE-ETH ESTRADIOL 0.25-35 MG-MCG PO TABS Oral Take 1 tablet by mouth daily.      BP 143/79  Pulse 66  Temp(Src) 98.7 F (37.1 C) (Oral)  Resp 17  Ht 5\' 5"  (1.651 m)  Wt 189 lb (85.73 kg)  BMI 31.45 kg/m2  SpO2 100%  LMP 09/19/2011  Physical Exam  Nursing note and vitals reviewed. Constitutional: She is oriented to person, place, and time. She appears well-developed and well-nourished. No distress.  HENT:  Head: Normocephalic and atraumatic.  Eyes: EOM are normal.  Neck: Neck supple.  Cardiovascular: Normal rate.   Pulmonary/Chest: Effort normal. No respiratory distress.  Abdominal: She exhibits no pulsatile midline mass and no mass. There is tenderness in the right upper quadrant and left upper quadrant.  There is no guarding. No hernia.       No induration, no erythema, well healed incision from previous laparoscopy surgery,  No palpable hernia  Musculoskeletal: Normal range of motion.  Neurological: She is alert and oriented to person, place, and time.  Skin: Skin is warm and dry.  Psychiatric: She has a normal mood and affect. Her behavior is normal.   rectal exam shows a mild tenderness without swelling or induration.  ED Course  Procedures (including critical care time)   DIAGNOSTIC STUDIES: Oxygen  Saturation is 100% on room air, normal by my interpretation.     COORDINATION OF CARE: 0821-  Physical exam complete.  Will CT abdomen and check blood work.   LABS / RADIOLOGY:   Labs Reviewed  CBC - Abnormal; Notable for the following:    Hemoglobin 11.5 (*)    HCT 35.0 (*)    All other components within normal limits  DIFFERENTIAL  URINALYSIS, ROUTINE W REFLEX MICROSCOPIC  PREGNANCY, URINE  COMPREHENSIVE METABOLIC PANEL  LIPASE, BLOOD   No results found.       MDM  Patient with abdominal pain for a couple months. States she had a previous hernia repair. Mild diffuse tenderness. Laboratory reassuring. CT was done and did not show a cause for pain. We'll have her followup with GI.       MEDICATIONS GIVEN IN THE E.D. Scheduled Meds:   Continuous Infusions:       IMPRESSION: No diagnosis found. Abdominal pain   NEW MEDICATIONS: New Prescriptions   No medications on file      I personally performed the services described in this documentation, which was scribed in my presence. The recorded information has been reviewed and considered.   Scribe       [employer]. Rubin Payor, MD 10/06/11 1020

## 2012-03-06 ENCOUNTER — Encounter (INDEPENDENT_AMBULATORY_CARE_PROVIDER_SITE_OTHER): Payer: Self-pay | Admitting: Surgery

## 2012-03-06 ENCOUNTER — Ambulatory Visit (INDEPENDENT_AMBULATORY_CARE_PROVIDER_SITE_OTHER): Payer: 59 | Admitting: Surgery

## 2012-03-06 VITALS — BP 118/64 | HR 68 | Temp 97.4°F | Resp 16 | Ht 66.0 in | Wt 190.6 lb

## 2012-03-06 DIAGNOSIS — IMO0002 Reserved for concepts with insufficient information to code with codable children: Secondary | ICD-10-CM

## 2012-03-06 DIAGNOSIS — S39011A Strain of muscle, fascia and tendon of abdomen, initial encounter: Secondary | ICD-10-CM | POA: Insufficient documentation

## 2012-03-06 DIAGNOSIS — M6200 Separation of muscle (nontraumatic), unspecified site: Secondary | ICD-10-CM

## 2012-03-06 DIAGNOSIS — Z720 Tobacco use: Secondary | ICD-10-CM

## 2012-03-06 DIAGNOSIS — M6208 Separation of muscle (nontraumatic), other site: Secondary | ICD-10-CM | POA: Insufficient documentation

## 2012-03-06 DIAGNOSIS — F17200 Nicotine dependence, unspecified, uncomplicated: Secondary | ICD-10-CM

## 2012-03-06 NOTE — Progress Notes (Signed)
Subjective:     Patient ID: [name], female   DOB: 08-Aug-1980, 31 y.o.   MRN: [id number]  HPI  [name]  [birth year] [id number]  Patient Care Team: Merlyn Albert, MD as PCP - General (Family Medicine)  This patient is a 31 y.o.female who presents today for surgical evaluation at the request of Dr. Looking at [facility] family medicine.   Reason for evaluation: Abdominal pain.  Question of recurrent hernia.  Patient is a pleasant healthy female.  She comes today with her young son.  Smokes a few cigarettes a day.  Had repair of a super umbilical ventral hernia with mesh by Dr. Franky Macho last year.  Initially did well.  No problems with wound infections.   Began to have intermittent soreness.  Has become more pronounced this year, especially after an episode of nausea and vomiting with a flu last winter.  Has tried ibuprofen and Aleve without much help.  She describes it as a diffuse upper abdominal soreness.  Worse with bending down or turning or twisting.  Claims to have a bowel movement every day.  Occasionally feels bloated.  No episodes of severe constipation or diarrhea.  And walk 30 minutes without difficulty.  No fevers or chills.  Tolerates all foods fine.  No problems with spicy or fatty foods.  No heartburn or reflux.  No history of pancreatitis or hepatitis.  No personal nor family history of GI/colon cancer, inflammatory bowel disease, irritable bowel syndrome, allergy such as Celiac Sprue, dietary/dairy problems, colitis, ulcers nor gastritis.  No recent sick contacts/gastroenteritis.  No travel outside the country.  No changes in diet.    Patient Active Problem List  Diagnosis  . TONSILLITIS  . FIBROCYSTIC BREAST DISEASE  . AMENORRHEA  . SYMPTOM, HEADACHE  . PELVIC  PAIN  . ELEVATED BLOOD PRESSURE  . Abdominal muscle strain  . Diastasis recti  . Tobacco abuse    Past Medical History  Diagnosis Date  . Heart murmur     Past Surgical History    Procedure Date  . Cesarean section     x2  . Laparoscopic incisional / umbilical / ventral hernia repair 09/2010    Dr. Lovell Sheehan    History   Social History  . Marital Status: Married    Spouse Name: N/A    Number of Children: N/A  . Years of Education: N/A   Occupational History  . Not on file.   Social History Main Topics  . Smoking status: Current Every Day Smoker    Types: Cigarettes  . Smokeless tobacco: Never Used  . Alcohol Use: Yes     occas  . Drug Use: No  . Sexually Active: Not on file   Other Topics Concern  . Not on file   Social History Narrative  . No narrative on file    Family History  Problem Relation Age of Onset  . Cancer Paternal Uncle     unknown  . Cancer Paternal Grandmother     unknown    Current Outpatient Prescriptions  Medication Sig Dispense Refill  . Multiple Vitamin (MULITIVITAMIN WITH MINERALS) TABS Take 1 tablet by mouth daily.      . norgestimate-ethinyl estradiol (ORTHO-CYCLEN,SPRINTEC,PREVIFEM) 0.25-35 MG-MCG tablet Take 1 tablet by mouth daily.         No Known Allergies  BP 118/64  Pulse 68  Temp 97.4 F (36.3 C) (Temporal)  Resp 16  Ht 5\' 6"  (1.676 m)  Wt 190 lb 9.6 oz (  86.456 kg)  BMI 30.76 kg/m2  LMP 02/29/2012  No results found.   Review of Systems  Constitutional: Negative for fever, chills, diaphoresis, appetite change and fatigue.  HENT: Negative for ear pain, sore throat, trouble swallowing, neck pain and ear discharge.   Eyes: Negative for photophobia, discharge and visual disturbance.  Respiratory: Negative for cough, choking, chest tightness, shortness of breath, wheezing and stridor.   Cardiovascular: Negative for chest pain, palpitations and leg swelling.  Gastrointestinal: Positive for abdominal pain and abdominal distention. Negative for nausea, vomiting, diarrhea, constipation, blood in stool, anal bleeding and rectal pain.  Genitourinary: Negative for dysuria, frequency and difficulty  urinating.  Musculoskeletal: Negative for back pain, joint swelling, arthralgias and gait problem.  Skin: Negative for color change, pallor and rash.  Neurological: Negative for dizziness, speech difficulty, weakness and numbness.  Hematological: Negative for adenopathy.  Psychiatric/Behavioral: Negative for confusion and agitation. The patient is not nervous/anxious.        Objective:   Physical Exam  Constitutional: She is oriented to person, place, and time. She appears well-developed and well-nourished. No distress.  HENT:  Head: Normocephalic.  Mouth/Throat: Oropharynx is clear and moist. No oropharyngeal exudate.  Eyes: Conjunctivae normal and EOM are normal. Pupils are equal, round, and reactive to light. No scleral icterus.  Neck: Normal range of motion. Neck supple. No tracheal deviation present.  Cardiovascular: Normal rate, regular rhythm and intact distal pulses.   Pulmonary/Chest: Effort normal and breath sounds normal. No respiratory distress. She exhibits no tenderness.  Abdominal: Soft. Bowel sounds are normal. She exhibits no distension, no pulsatile liver and no mass. There is tenderness in the epigastric area. There is no rigidity, no rebound, no guarding, no CVA tenderness, no tenderness at McBurney's point and negative Murphy's sign. No hernia. Hernia confirmed negative in the ventral area, confirmed negative in the right inguinal area and confirmed negative in the left inguinal area.    Genitourinary: No vaginal discharge found.  Musculoskeletal: Normal range of motion. She exhibits no tenderness.  Lymphadenopathy:    She has no cervical adenopathy.       Right: No inguinal adenopathy present.       Left: No inguinal adenopathy present.  Neurological: She is alert and oriented to person, place, and time. No cranial nerve deficit. She exhibits normal muscle tone. Coordination normal.  Skin: Skin is warm and dry. No rash noted. She is not diaphoretic. No erythema.    Psychiatric: She has a normal mood and affect. Her behavior is normal. Judgment and thought content normal.       Assessment:     Mild diffuse upper abdominal pain in the setting of ventral hernia  With diastases.  No evidence of hernia recurrence.  No evidence of bowel obstruction.  Story not consistent with reflux or biliary colic.  I suspect she has some constipation as well given the moderate stool burden on CT scan  Probable musculoskeletal strain.    Plan:     I discussed with her the importance of trying to calm the irritated muscle and scar tissue.  Stop smoking.  Do heat and high-dose anti-inflammatories round-the-clock.  Do this for a month.  It should help it resolve.  If it does not help, we will add gabapentin or possibly amitriptyline to help.  There is no focal area to inject.  She seemed reassured that there is no recurrent hernia.  I explained the mild bulging that she sees is related to the diastases recti and  some obesity as well.    Increase activity as tolerated to regular activity.  Do not push through pain.  Diet as tolerated. Bowel regimen to avoid problems.  She has a moderate stool burden on the right side and I wonder if that contributes to her bloating.  Return to clinic p.r.n.  Call us back for followup to make sure she is improving.  Instructions discussed.  Followup with primary care physician for other health issues as would normally be done.  Questions answered.  The patient expressed understanding and appreciation

## 2012-03-06 NOTE — Patient Instructions (Addendum)
Managing Pain  Pain after surgery or related to activity is often due to strain/injury to muscle, tendon, nerves and/or incisions.  This pain is usually short-term and will improve in a few months.   Many people find it helpful to do the following things TOGETHER to help speed the process of healing and to get back to regular activity more quickly:  1. Avoid heavy physical activity a.  no lifting greater than 20 pounds b. Do not "push through" the pain.  Listen to your body and avoid positions and maneuvers than reproduce the pain c. Walking is okay as tolerated, but go slowly and stop when getting sore.  d. Remember: If it hurts to do it, then don't do it! 2. Take Anti-inflammatory medication  a. Take with food/snack around the clock for 1-2 weeks i. This helps the muscle and nerve tissues become less irritable and calm down faster b. Choose ONE of the following over-the-counter medications: i. Naproxen 220mg  tabs (ex. Aleve) 1-2 pills twice a day  ii. Ibuprofen 200mg  tabs (ex. Advil, Motrin) 3-4 pills with every meal and just before bedtime iii. Acetaminophen 500mg  tabs (Tylenol) 1-2 pills with every meal and just before bedtime 3. Use a Heating pad or Ice/Cold Pack a. 4-6 times a day b. May use warm bath/hottub  or showers 4. Try Gentle Massage and/or Stretching  a. at the area of pain many times a day b. stop if you feel pain - do not overdo it  Try these steps together to help you body heal faster and avoid making things get worse.  Doing just one of these things may not be enough.    If you are not getting better after two weeks or are noticing you are getting worse, contact our office for further advice; we may need to re-evaluate you & see what other things we can do to help.  Muscle Strain A muscle strain (pulled muscle) happens when a muscle is over-stretched. Recovery usually takes 5 to 6 weeks.  HOME CARE   Put ice on the injured area.  Put ice in a plastic  bag.  Place a towel between your skin and the bag.  Leave the ice on for 15 to 20 minutes at a time, every hour for the first 2 days.  Do not use the muscle for several days or until your doctor says you can. Do not use the muscle if you have pain.  Wrap the injured area with an elastic bandage for comfort. Do not put it on too tightly.  Only take medicine as told by your doctor.  Warm up before exercise. This helps prevent muscle strains. GET HELP RIGHT AWAY IF:  There is increased pain or puffiness (swelling) in the affected area. MAKE SURE YOU:   Understand these instructions.  Will watch your condition.  Will get help right away if you are not doing well or get worse. Document Released: 02/03/2008 Document Revised: 07/19/2011 Document Reviewed: 02/03/2008 [facility] Patient Information 2013 [place], [place].  Managing Pain  Pain after surgery or related to activity is often due to strain/injury to muscle, tendon, nerves and/or incisions.  This pain is usually short-term and will improve in a few months.   Many people find it helpful to do the following things TOGETHER to help speed the process of healing and to get back to regular activity more quickly:  5. Avoid heavy physical activity a.  no lifting greater than 20 pounds b. Do not "push through" the pain.  Listen to your body and avoid positions and maneuvers than reproduce the pain c. Walking is okay as tolerated, but go slowly and stop when getting sore.  d. Remember: If it hurts to do it, then don't do it! 6. Take Anti-inflammatory medication  a. Take with food/snack around the clock for 1-2 weeks i. This helps the muscle and nerve tissues become less irritable and calm down faster b. Choose ONE of the following over-the-counter medications: i. Naproxen 220mg  tabs (ex. Aleve) 2 pills twice a day  ii. Ibuprofen 200mg  tabs (ex. Advil, Motrin) 4 pills with every meal and just before bedtime 7. Use a Heating pad or  Ice/Cold Pack a. 4-6 times a day b. May use warm bath/hottub  or showers 8. Try Gentle Massage and/or Stretching  a. at the area of pain many times a day b. stop if you feel pain - do not overdo it  Try these steps together to help you body heal faster and avoid making things get worse.  Doing just one of these things will not be enough.    If you are not getting better after one month or are noticing you are getting worse, contact our office for further advice; we may need to re-evaluate you & see what other things we can do to help.  GETTING TO GOOD BOWEL HEALTH. Irregular bowel habits such as constipation and diarrhea can lead to many problems over time.  Having one soft bowel movement a day is the most important way to prevent further problems.  The anorectal canal is designed to handle stretching and feces to safely manage our ability to get rid of solid waste (feces, poop, stool) out of our body.  BUT, hard constipated stools can act like ripping concrete bricks and diarrhea can be a burning fire to this very sensitive area of our body, causing inflamed hemorrhoids, anal fissures, increasing risk is perirectal abscesses, abdominal pain/bloating, an making irritable bowel worse.     The goal: ONE SOFT BOWEL MOVEMENT A DAY!  To have soft, regular bowel movements:    Drink at least 8 tall glasses of water a day.     Take plenty of fiber.  Fiber is the undigested part of plant food that passes into the colon, acting s "natures broom" to encourage bowel motility and movement.  Fiber can absorb and hold large amounts of water. This results in a larger, bulkier stool, which is soft and easier to pass. Work gradually over several weeks up to 6 servings a day of fiber (25g a day even more if needed) in the form of: o Vegetables -- Root (potatoes, carrots, turnips), leafy green (lettuce, salad greens, celery, spinach), or cooked high residue (cabbage, broccoli, etc) o Fruit -- Fresh (unpeeled skin &  pulp), Dried (prunes, apricots, cherries, etc ),  or stewed ( applesauce)  o Whole grain breads, pasta, etc (whole wheat)  o Bran cereals    Bulking Agents -- This type of water-retaining fiber generally is easily obtained each day by one of the following:  o Psyllium bran -- The psyllium plant is remarkable because its ground seeds can retain so much water. This product is available as Metamucil, Konsyl, Effersyllium, Per Diem Fiber, or the less expensive generic preparation in drug and health food stores. Although labeled a laxative, it really is not a laxative.  o Methylcellulose -- This is another fiber derived from wood which also retains water. It is available as Citrucel. o Polyethylene Glycol - and "  artificial" fiber commonly called Miralax or Glycolax.  It is helpful for people with gassy or bloated feelings with regular fiber o Flax Seed - a less gassy fiber than psyllium   No reading or other relaxing activity while on the toilet. If bowel movements take longer than 5 minutes, you are too constipated   AVOID CONSTIPATION.  High fiber and water intake usually takes care of this.  Sometimes a laxative is needed to stimulate more frequent bowel movements, but    Laxatives are not a good long-term solution as it can wear the colon out. o Osmotics (Milk of Magnesia, Fleets phosphosoda, Magnesium citrate, MiraLax, GoLytely) are safer than  o Stimulants (Senokot, Castor Oil, Dulcolax, Ex Lax)    o Do not take laxatives for more than 7days in a row.    IF SEVERELY CONSTIPATED, try a Bowel Retraining Program: o Do not use laxatives.  o Eat a diet high in roughage, such as bran cereals and leafy vegetables.  o Drink six (6) ounces of prune or apricot juice each morning.  o Eat two (2) large servings of stewed fruit each day.  o Take one (1) heaping tablespoon of a psyllium-based bulking agent twice a day. Use sugar-free sweetener when possible to avoid excessive calories.  o Eat a normal  breakfast.  o Set aside 15 minutes after breakfast to sit on the toilet, but do not strain to have a bowel movement.  o If you do not have a bowel movement by the third day, use an enema and repeat the above steps.    Controlling diarrhea o Switch to liquids and simpler foods for a few days to avoid stressing your intestines further. o Avoid dairy products (especially milk & ice cream) for a short time.  The intestines often can lose the ability to digest lactose when stressed. o Avoid foods that cause gassiness or bloating.  Typical foods include beans and other legumes, cabbage, broccoli, and dairy foods.  Every person has some sensitivity to other foods, so listen to our body and avoid those foods that trigger problems for you. o Adding fiber (Citrucel, Metamucil, psyllium, Miralax) gradually can help thicken stools by absorbing excess fluid and retrain the intestines to act more normally.  Slowly increase the dose over a few weeks.  Too much fiber too soon can backfire and cause cramping & bloating. o Probiotics (such as active yogurt, Align, etc) may help repopulate the intestines and colon with normal bacteria and calm down a sensitive digestive tract.  Most studies show it to be of mild help, though, and such products can be costly. o Medicines:   Bismuth subsalicylate (ex. Kayopectate, Pepto Bismol) every 30 minutes for up to 6 doses can help control diarrhea.  Avoid if pregnant.   Loperamide (Immodium) can slow down diarrhea.  Start with two tablets (4mg  total) first and then try one tablet every 6 hours.  Avoid if you are having fevers or severe pain.  If you are not better or start feeling worse, stop all medicines and call your doctor for advice o Call your doctor if you are getting worse or not better.  Sometimes further testing (cultures, endoscopy, X-ray studies, bloodwork, etc) may be needed to help diagnose and treat the cause of the diarrhea. o   Smoking Cessation, Tips for  Success YOU CAN QUIT SMOKING If you are ready to quit smoking, congratulations! You have chosen to help yourself be healthier. Cigarettes bring nicotine, tar, carbon monoxide, and other  irritants into your body. Your lungs, heart, and blood vessels will be able to work better without these poisons. There are many different ways to quit smoking. Nicotine gum, nicotine patches, a nicotine inhaler, or nicotine nasal spray can help with physical craving. Hypnosis, support groups, and medicines help break the habit of smoking. Here are some tips to help you quit for good.  Throw away all cigarettes.  Clean and remove all ashtrays from your home, work, and car.  On a card, write down your reasons for quitting. Carry the card with you and read it when you get the urge to smoke.  Cleanse your body of nicotine. Drink enough water and fluids to keep your urine clear or pale yellow. Do this after quitting to flush the nicotine from your body.  Learn to predict your moods. Do not let a bad situation be your excuse to have a cigarette. Some situations in your life might tempt you into wanting a cigarette.  Never have "just one" cigarette. It leads to wanting another and another. Remind yourself of your decision to quit.  Change habits associated with smoking. If you smoked while driving or when feeling stressed, try other activities to replace smoking. Stand up when drinking your coffee. Brush your teeth after eating. Sit in a different chair when you read the paper. Avoid alcohol while trying to quit, and try to drink fewer caffeinated beverages. Alcohol and caffeine may urge you to smoke.  Avoid foods and drinks that can trigger a desire to smoke, such as sugary or spicy foods and alcohol.  Ask people who smoke not to smoke around you.  Have something planned to do right after eating or having a cup of coffee. Take a walk or exercise to perk you up. This will help to keep you from overeating.  Try a  relaxation exercise to calm you down and decrease your stress. Remember, you may be tense and nervous for the first 2 weeks after you quit, but this will pass.  Find new activities to keep your hands busy. Play with a pen, coin, or rubber band. Doodle or draw things on paper.  Brush your teeth right after eating. This will help cut down on the craving for the taste of tobacco after meals. You can try mouthwash, too.  Use oral substitutes, such as lemon drops, carrots, a cinnamon stick, or chewing gum, in place of cigarettes. Keep them handy so they are available when you have the urge to smoke.  When you have the urge to smoke, try deep breathing.  Designate your home as a nonsmoking area.  If you are a heavy smoker, ask your caregiver about a prescription for nicotine chewing gum. It can ease your withdrawal from nicotine.  Reward yourself. Set aside the cigarette money you save and buy yourself something nice.  Look for support from others. Join a support group or smoking cessation program. Ask someone at home or at work to help you with your plan to quit smoking.  Always ask yourself, "Do I need this cigarette or is this just a reflex?" Tell yourself, "Today, I choose not to smoke," or "I do not want to smoke." You are reminding yourself of your decision to quit, even if you do smoke a cigarette. HOW WILL I FEEL WHEN I QUIT SMOKING?  The benefits of not smoking start within days of quitting.  You may have symptoms of withdrawal because your body is used to nicotine (the addictive substance in cigarettes).  You may crave cigarettes, be irritable, feel very hungry, cough often, get headaches, or have difficulty concentrating.  The withdrawal symptoms are only temporary. They are strongest when you first quit but will go away within 10 to 14 days.  When withdrawal symptoms occur, stay in control. Think about your reasons for quitting. Remind yourself that these are signs that your body is  healing and getting used to being without cigarettes.  Remember that withdrawal symptoms are easier to treat than the major diseases that smoking can cause.  Even after the withdrawal is over, expect periodic urges to smoke. However, these cravings are generally short-lived and will go away whether you smoke or not. Do not smoke!  If you relapse and smoke again, do not lose hope. Most smokers quit 3 times before they are successful.  If you relapse, do not give up! Plan ahead and think about what you will do the next time you get the urge to smoke. LIFE AS A NONSMOKER: MAKE IT FOR A MONTH, MAKE IT FOR LIFE Day 1: Hang this page where you will see it every day. Day 2: Get rid of all ashtrays, matches, and lighters. Day 3: Drink water. Breathe deeply between sips. Day 4: Avoid places with smoke-filled air, such as bars, clubs, or the smoking section of restaurants. Day 5: Keep track of how much money you save by not smoking. Day 6: Avoid boredom. Keep a good book with you or go to the movies. Day 7: Reward yourself! One week without smoking! Day 8: Make a dental appointment to get your teeth cleaned. Day 9: Decide how you will turn down a cigarette before it is offered to you. Day 10: Review your reasons for quitting. Day 11: Distract yourself. Stay active to keep your mind off smoking and to relieve tension. Take a walk, exercise, read a book, do a crossword puzzle, or try a new hobby. Day 12: Exercise. Get off the bus before your stop or use stairs instead of escalators. Day 13: Call on friends for support and encouragement. Day 14: Reward yourself! Two weeks without smoking! Day 15: Practice deep breathing exercises. Day 16: Bet a friend that you can stay a nonsmoker. Day 17: Ask to sit in nonsmoking sections of restaurants. Day 18: Hang up "No Smoking" signs. Day 19: Think of yourself as a nonsmoker. Day 20: Each morning, tell yourself you will not smoke. Day 21: Reward yourself!  Three weeks without smoking! Day 22: Think of smoking in negative ways. Remember how it stains your teeth, gives you bad breath, and leaves you short of breath. Day 23: Eat a nutritious breakfast. Day 24:Do not relive your days as a smoker. Day 25: Hold a pencil in your hand when talking on the telephone. Day 26: Tell all your friends you do not smoke. Day 27: Think about how much better food tastes. Day 28: Remember, one cigarette is one too many. Day 29: Take up a hobby that will keep your hands busy. Day 30: Congratulations! One month without smoking! Give yourself a big reward. Your caregiver can direct you to community resources or hospitals for support, which may include:  Group support.  Education.  Hypnosis.  Subliminal therapy. Document Released: 01/23/2004 Document Revised: 07/19/2011 Document Reviewed: 02/10/2009 [facility] Patient Information 2013 [place], [place].

## 2012-06-28 ENCOUNTER — Other Ambulatory Visit: Payer: Self-pay | Admitting: Adult Health

## 2012-06-28 ENCOUNTER — Other Ambulatory Visit (HOSPITAL_COMMUNITY)
Admission: RE | Admit: 2012-06-28 | Discharge: 2012-06-28 | Disposition: A | Discharge status: Home / Self Care | Payer: 59 | Source: Ambulatory Visit | Attending: Obstetrics and Gynecology | Admitting: Obstetrics and Gynecology

## 2012-06-28 DIAGNOSIS — Z01419 Encounter for gynecological examination (general) (routine) without abnormal findings: Secondary | ICD-10-CM | POA: Insufficient documentation

## 2012-06-28 DIAGNOSIS — Z1151 Encounter for screening for human papillomavirus (HPV): Secondary | ICD-10-CM | POA: Insufficient documentation

## 2012-08-08 ENCOUNTER — Encounter: Payer: Self-pay | Admitting: *Deleted

## 2012-08-09 ENCOUNTER — Ambulatory Visit (INDEPENDENT_AMBULATORY_CARE_PROVIDER_SITE_OTHER): Payer: 59 | Admitting: Family Medicine

## 2012-08-09 ENCOUNTER — Encounter: Payer: Self-pay | Admitting: Family Medicine

## 2012-08-09 VITALS — BP 136/94 | HR 80 | Wt 197.2 lb

## 2012-08-09 DIAGNOSIS — M545 Low back pain: Secondary | ICD-10-CM

## 2012-08-09 MED ORDER — NABUMETONE 750 MG PO TABS: 750.0000 mg | ORAL_TABLET | Freq: Two times a day (BID) | ORAL | Status: DC

## 2012-08-09 MED ORDER — CHLORZOXAZONE 500 MG PO TABS: 500.0000 mg | ORAL_TABLET | Freq: Four times a day (QID) | ORAL | Status: DC | PRN

## 2012-08-09 NOTE — Progress Notes (Signed)
  Subjective:    Patient ID: [name], female    DOB: Mar 26, 1981, 32 y.o.   MRN: [id number]  Back Pain This is a new problem. The current episode started in the past 7 days. The problem occurs constantly. The problem has been gradually worsening since onset. The quality of the pain is described as aching. The pain is at a severity of 5/10. The pain is severe. The pain is worse during the day. The symptoms are aggravated by bending and coughing. Stiffness is present in the morning. Associated symptoms include leg pain. Pertinent negatives include no abdominal pain, headaches or numbness. She has tried NSAIDs for the symptoms. The treatment provided moderate relief.   Patient notes intermittent odor to urine. No dysuria. No fever no chills.   Review of Systems  Gastrointestinal: Negative for abdominal pain.  Musculoskeletal: Positive for back pain.  Neurological: Negative for numbness and headaches.  All other systems reviewed and are negative.       Objective:   Physical Exam  Alert, some distress with motion. Vitals reviewed. HEENT normal. Lungs clear. Heart regular in rhythm. Left lower lumbar region tender to palpation. Spine nontender. Negative straight leg raise.      Assessment & Plan:  Impression lumbar strain with spasm-discussed. Plan exercise encourage once result.

## 2012-12-12 ENCOUNTER — Encounter (HOSPITAL_COMMUNITY): Payer: Self-pay | Admitting: *Deleted

## 2012-12-12 DIAGNOSIS — Z79899 Other long term (current) drug therapy: Secondary | ICD-10-CM | POA: Insufficient documentation

## 2012-12-12 DIAGNOSIS — M79609 Pain in unspecified limb: Secondary | ICD-10-CM | POA: Insufficient documentation

## 2012-12-12 DIAGNOSIS — R011 Cardiac murmur, unspecified: Secondary | ICD-10-CM | POA: Insufficient documentation

## 2012-12-12 DIAGNOSIS — Z87891 Personal history of nicotine dependence: Secondary | ICD-10-CM | POA: Insufficient documentation

## 2012-12-12 NOTE — ED Notes (Signed)
States she has pain in both feet, left worse than right, urinary frequency and episodes of heart fluttering

## 2012-12-13 ENCOUNTER — Emergency Department (HOSPITAL_COMMUNITY)
Admission: EM | Admit: 2012-12-13 | Discharge: 2012-12-13 | Disposition: A | Discharge status: Home / Self Care | Payer: 59 | Attending: Emergency Medicine | Admitting: Emergency Medicine

## 2012-12-13 DIAGNOSIS — M79672 Pain in left foot: Secondary | ICD-10-CM

## 2012-12-13 DIAGNOSIS — M79605 Pain in left leg: Secondary | ICD-10-CM

## 2012-12-13 LAB — URINALYSIS, ROUTINE W REFLEX MICROSCOPIC
Glucose, UA: NEGATIVE mg/dL
Ketones, ur: NEGATIVE mg/dL
Leukocytes, UA: NEGATIVE
Nitrite: NEGATIVE
Protein, ur: NEGATIVE mg/dL
Urobilinogen, UA: 0.2 mg/dL (ref 0.0–1.0)

## 2012-12-13 LAB — D-DIMER, QUANTITATIVE: D-Dimer, Quant: <0.27 ug/mL-FEU (ref 0.00–0.48)

## 2012-12-13 MED ORDER — IBUPROFEN 800 MG PO TABS
800.0000 mg | ORAL_TABLET | Freq: Once | ORAL | Status: AC
  Administered 2012-12-13: 800 mg via ORAL
  Filled 2012-12-13: qty 1

## 2012-12-13 MED ORDER — HYDROCODONE-ACETAMINOPHEN 5-325 MG PO TABS: 1.0000 | ORAL_TABLET | Freq: Four times a day (QID) | ORAL | Status: DC | PRN

## 2012-12-13 NOTE — ED Notes (Signed)
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

## 2012-12-13 NOTE — ED Notes (Signed)
Patient given discharge instruction, verbalized understand. Patient ambulatory out of the department.  

## 2012-12-13 NOTE — ED Notes (Signed)
Lab at the bedside 

## 2012-12-15 NOTE — ED Provider Notes (Signed)
CSN: [id number]     Arrival date & time 12/12/12  2146 History     First MD Initiated Contact with Patient 12/13/12 0005     Chief Complaint  Patient presents with  . Foot Pain   patient HPI patient presents with multiple complaints she says the soles of both feet including her heels especially are hurting, it is severe, worse on walking and standing. Left is worse than right. She has not taken anything for it.  She also complained about a transient episode of her heart fluttering earlier today this is gone, she cannot take anything for it, not associated with any chest pain, shortness of breath, dizziness or lightheadedness or changes in vision, no numbness or tingling. Patient also complains about urinary frequency but denies dysuria, pelvic pain, back pain, denies any fevers, chills, rash  Past Medical History  Diagnosis Date  . Heart murmur    Past Surgical History  Procedure Laterality Date  . Cesarean section      x2  . Laparoscopic incisional / umbilical / ventral hernia repair  09/2010    Dr. Lovell Sheehan   Family History  Problem Relation Age of Onset  . Cancer Paternal Uncle     unknown  . Cancer Paternal Grandmother     unknown   History  Substance Use Topics  . Smoking status: Former Smoker    Types: Cigarettes  . Smokeless tobacco: Never Used  . Alcohol Use: Yes     Comment: occas   OB History   Grav Para Term Preterm Abortions TAB SAB Ect Mult Living                 Review of Systems At least 10pt or greater review of systems completed and are negative except where specified in the HPI.  Allergies  Review of patient's allergies indicates no known allergies.  Home Medications   Current Outpatient Rx  Name  Route  Sig  Dispense  Refill  . Multiple Vitamin (MULITIVITAMIN WITH MINERALS) TABS   Oral   Take 1 tablet by mouth daily.         . norgestimate-ethinyl estradiol (ORTHO-CYCLEN,SPRINTEC,PREVIFEM) 0.25-35 MG-MCG tablet   Oral   Take 1 tablet by  mouth daily.         Marland Kitchen HYDROcodone-acetaminophen (NORCO/VICODIN) 5-325 MG per tablet   Oral   Take 1-2 tablets by mouth every 6 (six) hours as needed for pain.   17 tablet   0    BP 150/84  Pulse 74  Temp(Src) 98.2 F (36.8 C) (Oral)  Resp 20  Ht 5\' 5"  (1.651 m)  Wt 199 lb (90.266 kg)  BMI 33.12 kg/m2  SpO2 100%  LMP 12/09/2012 Physical Exam  Nursing notes reviewed.  Electronic medical record reviewed. VITAL SIGNS:   Filed Vitals:   12/12/12 2205  BP: 150/84  Pulse: 74  Temp: 98.2 F (36.8 C)  TempSrc: Oral  Resp: 20  Height: 5\' 5"  (1.651 m)  Weight: 199 lb (90.266 kg)  SpO2: 100%   CONSTITUTIONAL: Awake, oriented, appears non-toxic HENT: Atraumatic, normocephalic, oral mucosa pink and moist, airway patent. Nares patent without drainage. External ears normal. EYES: Conjunctiva clear, EOMI, PERRLA NECK: Trachea midline, non-tender, supple CARDIOVASCULAR: Normal heart rate, Normal rhythm, No murmurs, rubs, gallops PULMONARY/CHEST: Clear to auscultation, no rhonchi, wheezes, or rales. Symmetrical breath sounds. Non-tender. ABDOMINAL: Non-distended, soft, non-tender - no rebound or guarding.  BS normal. NEUROLOGIC: Non-focal, moving all four extremities, no gross sensory or motor deficits. EXTREMITIES:  No clubbing, cyanosis, or edema. Tenderness to palpation the soles of both feet especially in heels - specifically at the anterior aspect of the calcaneus SKIN: Warm, Dry, No erythema, No rash   ED Course   Procedures (including critical care time)  Date: 12/16/2012  Rate: 72  Rhythm: normal sinus rhythm  QRS Axis: normal  Intervals: normal  ST/T Wave abnormalities: Nonspecific T-wave changes  Conduction Disutrbances: none  Narrative Interpretation: No significant morphological changes, nonischemic     Labs Reviewed  D-DIMER, QUANTITATIVE  URINALYSIS, ROUTINE W REFLEX MICROSCOPIC   No results found. 1. Foot pain, bilateral   2. Leg pain, bilateral      MDM  Foot pain, pain medicine, followup - no emergent conditions identified at this time, considered DVT and lower extremities, d-dimer is negative.  Jones Skene, MD 12/16/12 [phone]

## 2013-03-06 ENCOUNTER — Ambulatory Visit (INDEPENDENT_AMBULATORY_CARE_PROVIDER_SITE_OTHER): Payer: 59 | Admitting: Adult Health

## 2013-03-06 ENCOUNTER — Encounter: Payer: Self-pay | Admitting: Adult Health

## 2013-03-06 ENCOUNTER — Encounter (INDEPENDENT_AMBULATORY_CARE_PROVIDER_SITE_OTHER): Payer: Self-pay

## 2013-03-06 VITALS — BP 120/84 | Ht 66.0 in | Wt 205.0 lb

## 2013-03-06 DIAGNOSIS — N920 Excessive and frequent menstruation with regular cycle: Secondary | ICD-10-CM

## 2013-03-06 DIAGNOSIS — R5383 Other fatigue: Secondary | ICD-10-CM

## 2013-03-06 DIAGNOSIS — R5381 Other malaise: Secondary | ICD-10-CM

## 2013-03-06 DIAGNOSIS — K649 Unspecified hemorrhoids: Secondary | ICD-10-CM

## 2013-03-06 HISTORY — DX: Excessive and frequent menstruation with regular cycle: N92.0

## 2013-03-06 LAB — CBC
HCT: 37.3 % (ref 36.0–46.0)
Hemoglobin: 12.6 g/dL (ref 12.0–15.0)
MCH: 27.8 pg (ref 26.0–34.0)
MCHC: 33.8 g/dL (ref 30.0–36.0)
MCV: 82.3 fL (ref 78.0–100.0)
Platelets: 381 10*3/uL (ref 150–400)
RBC: 4.53 MIL/uL (ref 3.87–5.11)
RDW: 14.2 % (ref 11.5–15.5)
WBC: 7.6 10*3/uL (ref 4.0–10.5)

## 2013-03-06 LAB — COMPREHENSIVE METABOLIC PANEL
ALT: 27 U/L (ref 0–35)
AST: 19 U/L (ref 0–37)
Albumin: 3.7 g/dL (ref 3.5–5.2)
Alkaline Phosphatase: 68 U/L (ref 39–117)
BUN: 13 mg/dL (ref 6–23)
Calcium: 9.3 mg/dL (ref 8.4–10.5)
Chloride: 101 mEq/L (ref 96–112)
Potassium: 3.6 mEq/L (ref 3.5–5.3)
Sodium: 139 mEq/L (ref 135–145)
Total Protein: 6.8 g/dL (ref 6.0–8.3)

## 2013-03-06 LAB — TSH: TSH: 0.839 u[IU]/mL (ref 0.350–4.500)

## 2013-03-06 NOTE — Patient Instructions (Signed)
Menorrhagia Dysfunctional uterine bleeding is different from a normal menstrual period. When periods are heavy or there is more bleeding than is usual for you, it is called menorrhagia. It may be caused by hormonal imbalance, or physical, metabolic, or other problems. Examination is necessary in order that your caregiver may treat treatable causes. If this is a continuing problem, a D&C may be needed. That means that the cervix (the opening of the uterus or womb) is dilated (stretched larger) and the lining of the uterus is scraped out. The tissue scraped out is then examined under a microscope by a specialist (pathologist) to make sure there is nothing of concern that needs further or more extensive treatment. HOME CARE INSTRUCTIONS   If medications were prescribed, take exactly as directed. Do not change or switch medications without consulting your caregiver.  Long term heavy bleeding may result in iron deficiency. Your caregiver may have prescribed iron pills. They help replace the iron your body lost from heavy bleeding. Take exactly as directed. Iron may cause constipation. If this becomes a problem, increase the bran, fruits, and roughage in your diet.  Do not take aspirin or medicines that contain aspirin one week before or during your menstrual period. Aspirin may make the bleeding worse.  If you need to change your sanitary pad or tampon more than once every 2 hours, stay in bed and rest as much as possible until the bleeding stops.  Eat well-balanced meals. Eat foods high in iron. Examples are leafy green vegetables, meat, liver, eggs, and whole grain breads and cereals. Do not try to lose weight until the abnormal bleeding has stopped and your blood iron level is back to normal. SEEK MEDICAL CARE IF:   You need to change your sanitary pad or tampon more than once an hour.  You develop nausea (feeling sick to your stomach) and vomiting, dizziness, or diarrhea while you are taking your  medicine.  You have any problems that may be related to the medicine you are taking. SEEK IMMEDIATE MEDICAL CARE IF:   You have a fever.  You develop chills.  You develop severe bleeding or start to pass blood clots.  You feel dizzy or faint. MAKE SURE YOU:   Understand these instructions.  Will watch your condition.  Will get help right away if you are not doing well or get worse. Document Released: 04/26/2005 Document Revised: 07/19/2011 Document Reviewed: 12/15/2007 [facility] Patient Information 2014 [place], [place]. Follow up in 1 week for [place]

## 2013-03-06 NOTE — Progress Notes (Signed)
Subjective:     Patient ID: [name], female   DOB: 11-08-1980, 32 y.o.   MRN: [id number]  HPI [name] is a 32 year old black female married in complaining of heavy periods and clots and fatigue. Stopped OCs last week they were not helping any longer.Her period starts out brown to black x 1 week then 1 week of red bleeding, has to sit on towel at times.has rectal itch at times.  Review of Systems See HPI Reviewed past medical,surgical, social and family history. Reviewed medications and allergies.     Objective:   Physical Exam BP 120/84  Ht 5\' 6"  (1.676 m)  Wt 205 lb (92.987 kg)  BMI 33.1 kg/m2  LMP 02/21/2013   Skin warm and dry.Pelvic: external genitalia is normal in appearance, vagina: white discharge without odor, cervix:smooth, uterus: normal size, shape and contour,  tender, no masses felt, adnexa: no masses or tenderness noted.Has small hemorrhoid   Assessment:     Menorrhagia Fatigue Rectal itch- hemorrhoid    Plan:     Check CBC,CMP and TSH Follow up in 1 week for [place] and see me to discuss options Review handout on menorrhagia and endo ablation Try prep H or anusol

## 2013-03-07 ENCOUNTER — Telehealth: Payer: Self-pay | Admitting: Adult Health

## 2013-03-07 NOTE — Telephone Encounter (Signed)
Left message to call in am  

## 2013-03-08 ENCOUNTER — Telehealth: Payer: Self-pay | Admitting: *Deleted

## 2013-03-08 NOTE — Telephone Encounter (Signed)
Pt aware of labs  

## 2013-03-16 ENCOUNTER — Ambulatory Visit (INDEPENDENT_AMBULATORY_CARE_PROVIDER_SITE_OTHER): Payer: 59 | Admitting: Adult Health

## 2013-03-16 ENCOUNTER — Encounter: Payer: Self-pay | Admitting: Adult Health

## 2013-03-16 ENCOUNTER — Ambulatory Visit (INDEPENDENT_AMBULATORY_CARE_PROVIDER_SITE_OTHER): Payer: 59

## 2013-03-16 ENCOUNTER — Other Ambulatory Visit: Payer: Self-pay | Admitting: Adult Health

## 2013-03-16 VITALS — BP 128/84 | Ht 66.0 in | Wt 204.0 lb

## 2013-03-16 DIAGNOSIS — N920 Excessive and frequent menstruation with regular cycle: Secondary | ICD-10-CM

## 2013-03-16 NOTE — Progress Notes (Signed)
Subjective:     Patient ID: [name], female   DOB: 02/13/1981, 32 y.o.   MRN: [id number]  HPI [name] is a 32 year old black female in for [place] for menorrhagia.During the [place] she was tender when probe touched uterus.  Review of Systems See HPI Reviewed past medical,surgical, social and family history. Reviewed medications and allergies.     Objective:   Physical Exam BP 128/84  Ht 5\' 6"  (1.676 m)  Wt 204 lb (92.534 kg)  BMI 32.94 kg/m2  LMP 02/21/2013   [place] reviewed with pt and Dr Despina Hidden, will proceed with ablation.US showed 5.1 mm endometrium that extends to C section scar.  Assessment:     Menorrhagia    Plan:     Follow up in 1 weeks with Dr Despina Hidden to schedule ablation, review handout agarin on ablation

## 2013-03-16 NOTE — Patient Instructions (Signed)
Endometrial Ablation Endometrial ablation removes the lining of the uterus (endometrium). It is usually a same-day, outpatient treatment. Ablation helps avoid major surgery, such as surgery to remove the cervix and uterus (hysterectomy). After endometrial ablation, you will have little or no menstrual bleeding and may not be able to have children. However, if you are premenopausal, you will need to use a reliable method of birth control following the procedure because of the small chance that pregnancy can occur. There are different reasons to have this procedure, which include:  Heavy periods.  Bleeding that is causing anemia.  Irregular bleeding.  Bleeding fibroids on the lining inside the uterus if they are smaller than 3 centimeters. This procedure should not be done if:  You want children in the future.  You have severe cramps with your menstrual period.  You have precancerous or cancerous cells in your uterus.  You were recently pregnant.  You have gone through menopause.  You have had major surgery on the uterus, such as a cesarean delivery. LET [facility] CARE PROVIDER KNOW ABOUT:  Any allergies you have.  All medicines you are taking, including vitamins, herbs, eye drops, creams, and over-the-counter medicines.  Previous problems you or members of your family have had with the use of anesthetics.  Any blood disorders you have.  Previous surgeries you have had.  Medical conditions you have. RISKS AND COMPLICATIONS  Generally, this is a safe procedure. However, as with any procedure, complications can occur. Possible complications include:  Perforation of the uterus.  Bleeding.  Infection of the uterus, bladder, or vagina.  Injury to surrounding organs.  An air bubble to the lung (air embolus).  Pregnancy following the procedure.  Failure of the procedure to help the problem, requiring hysterectomy.  Decreased ability to diagnose cancer in the lining of  the uterus. BEFORE THE PROCEDURE  The lining of the uterus must be tested to make sure there is no pre-cancerous or cancer cells present.  An ultrasound may be performed to look at the size of the uterus and to check for abnormalities.  Medicines may be given to thin the lining of the uterus. PROCEDURE  During the procedure, your health care provider will use a tool called a resectoscope to help see inside your uterus. There are different ways to remove the lining of your uterus.   Radiofrequency  This method uses a radiofrequency-alternating electric current to remove the lining of the uterus.  Cryotherapy This method uses extreme cold to freeze the lining of the uterus.  Heated-Free Liquid  This method uses heated salt (saline) solution to remove the lining of the uterus.  Microwave This method uses high-energy microwaves to heat up the lining of the uterus to remove it.  Thermal balloon  This method involves inserting a catheter with a balloon tip into the uterus. The balloon tip is filled with heated fluid to remove the lining of the uterus. AFTER THE PROCEDURE  After your procedure, do not have sexual intercourse or insert anything into your vagina until permitted by your health care provider. After the procedure, you may experience:  Cramps.  Vaginal discharge.  Frequent urination. Document Released: 03/05/2004 Document Revised: 12/27/2012 Document Reviewed: 09/27/2012 [facility] Patient Information 2014 [place], [place]. Return in 1 week with Dr Despina Hidden

## 2013-03-23 ENCOUNTER — Encounter: Payer: Self-pay | Admitting: Obstetrics & Gynecology

## 2013-03-23 ENCOUNTER — Ambulatory Visit (INDEPENDENT_AMBULATORY_CARE_PROVIDER_SITE_OTHER): Payer: 59 | Admitting: Obstetrics & Gynecology

## 2013-03-23 VITALS — BP 120/80 | Ht 65.0 in | Wt 203.0 lb

## 2013-03-23 DIAGNOSIS — N946 Dysmenorrhea, unspecified: Secondary | ICD-10-CM

## 2013-03-23 DIAGNOSIS — N920 Excessive and frequent menstruation with regular cycle: Secondary | ICD-10-CM

## 2013-03-23 NOTE — Progress Notes (Signed)
Patient ID: [name], female   DOB: Nov 12, 1980, 32 y.o.   MRN: [id number] Preoperative History and Physical  [name] is a 32 y.o. G2P2 with Patient's last menstrual period was 03/22/2013. admitted for a hysteroscopy uterine curettage and endometrial ablation for menometrorrhagia, dysmenorrhea with normal sonogram.  Did not respond to oral contraceptives  PMH:    Past Medical History  Diagnosis Date  . Heart murmur   . Menorrhagia 03/06/2013    PSH:     Past Surgical History  Procedure Laterality Date  . Cesarean section      x2  . Laparoscopic incisional / umbilical / ventral hernia repair  09/2010    Dr. Lovell Sheehan  . Tubal ligation      POb/GynH:      OB History   Grav Para Term Preterm Abortions TAB SAB Ect Mult Living   2 2        2       SH:   History  Substance Use Topics  . Smoking status: Former Smoker    Types: Cigarettes  . Smokeless tobacco: Never Used  . Alcohol Use: Yes     Comment: occas    FH:    Family History  Problem Relation Age of Onset  . Cancer Paternal Uncle     unknown  . Cancer Paternal Grandmother     unknown  . Diabetes Maternal Grandfather   . Alcohol abuse Paternal Grandfather      Allergies: No Known Allergies  Medications:      Current outpatient prescriptions:Multiple Vitamin (MULITIVITAMIN WITH MINERALS) TABS, Take 1 tablet by mouth daily., Disp: , Rfl: ;  norgestimate-ethinyl estradiol (ORTHO-CYCLEN,SPRINTEC,PREVIFEM) 0.25-35 MG-MCG tablet, Take 1 tablet by mouth daily., Disp: , Rfl:   Review of Systems:   Review of Systems  Constitutional: Negative for fever, chills, weight loss, malaise/fatigue and diaphoresis.  HENT: Negative for hearing loss, ear pain, nosebleeds, congestion, sore throat, neck pain, tinnitus and ear discharge.   Eyes: Negative for blurred vision, double vision, photophobia, pain, discharge and redness.  Respiratory: Negative for cough, hemoptysis, sputum production, shortness of breath,  wheezing and stridor.   Cardiovascular: Negative for chest pain, palpitations, orthopnea, claudication, leg swelling and PND.  Gastrointestinal: Positive for abdominal pain. Negative for heartburn, nausea, vomiting, diarrhea, constipation, blood in stool and melena.  Genitourinary: Negative for dysuria, urgency, frequency, hematuria and flank pain.  Musculoskeletal: Negative for myalgias, back pain, joint pain and falls.  Skin: Negative for itching and rash.  Neurological: Negative for dizziness, tingling, tremors, sensory change, speech change, focal weakness, seizures, loss of consciousness, weakness and headaches.  Endo/Heme/Allergies: Negative for environmental allergies and polydipsia. Does not bruise/bleed easily.  Psychiatric/Behavioral: Negative for depression, suicidal ideas, hallucinations, memory loss and substance abuse. The patient is not nervous/anxious and does not have insomnia.      PHYSICAL EXAM:  Blood pressure 120/80, height 5\' 5"  (1.651 m), weight 203 lb (92.08 kg), last menstrual period 03/22/2013.    Vitals reviewed. Constitutional: She is oriented to person, place, and time. She appears well-developed and well-nourished.  HENT:  Head: Normocephalic and atraumatic.  Right Ear: External ear normal.  Left Ear: External ear normal.  Nose: Nose normal.  Mouth/Throat: Oropharynx is clear and moist.  Eyes: Conjunctivae and EOM are normal. Pupils are equal, round, and reactive to light. Right eye exhibits no discharge. Left eye exhibits no discharge. No scleral icterus.  Neck: Normal range of motion. Neck supple. No tracheal deviation present. No thyromegaly present.  Cardiovascular:  Normal rate, regular rhythm, normal heart sounds and intact distal pulses.  Exam reveals no gallop and no friction rub.   No murmur heard. Respiratory: Effort normal and breath sounds normal. No respiratory distress. She has no wheezes. She has no rales. She exhibits no tenderness.  GI:  Soft. Bowel sounds are normal. She exhibits no distension and no mass. There is tenderness. There is no rebound and no guarding.  Genitourinary:       Vulva is normal without lesions Vagina is pink moist without discharge Cervix normal in appearance and pap is normal Uterus is enlarged to 16-18 weeks size due to multiple fibroids the largest of which is 6.1 cm Adnexa is negative with normal sized ovaries by sonogram  Musculoskeletal: Normal range of motion. She exhibits no edema and no tenderness.  Neurological: She is alert and oriented to person, place, and time. She has normal reflexes. She displays normal reflexes. No cranial nerve deficit. She exhibits normal muscle tone. Coordination normal.  Skin: Skin is warm and dry. No rash noted. No erythema. No pallor.  Psychiatric: She has a normal mood and affect. Her behavior is normal. Judgment and thought content normal.    Labs: No results found for this or any previous visit (from the past 336 hour(s)).  EKG: Orders placed during the hospital encounter of 12/13/12  . EKG 12-LEAD  . EKG 12-LEAD  . EKG    Imaging Studies: US Transvaginal Non-ob  04-13-13   GYNECOLOGIC SONOGRAM   [name] is a 32 y.o. G2P2 LMP 02/21/2013 for a pelvic sonogram for  menorrhagia. Pt s/p c/s x2 and BTL.  Uterus                      8.7 x 6.2 x 5.1 cm, anteverted uterus no  myometrial masses noted, pt very tender to palpation to uterus with  vaginal probe  Endometrium          5.1 mm, symmetrical, although endometrial  cavity/tissue seems to extend into prior c/s site  Right ovary             3.0 x 1.9 x 1.8 cm,   Left ovary                3.6 x 2.8 x 2.5 cm, slightly tender to palpation  with vaginal probe  No free fluid or adnexal masses noted within pelvis  Technician Comments:  No myometrial or adnexal masses noted within pelvis, pt very tender to  palpation to uterus with vaginal probe, endometrial cavity seems to extend  into previous c/s site/scar    [name] 2013-04-13 10:26 AM        Assessment: Patient Active Problem List   Diagnosis Date Noted  . Excessive or frequent menstruation 03/23/2013  . Dysmenorrhea 03/23/2013  . Menorrhagia 03/06/2013  . Abdominal muscle strain 03/06/2012  . Diastasis recti 03/06/2012  . Tobacco abuse 03/06/2012  . TONSILLITIS 05/15/2008  . AMENORRHEA 11/30/2006  . SYMPTOM, HEADACHE 11/30/2006  . PELVIC  PAIN 11/30/2006  . ELEVATED BLOOD PRESSURE 11/30/2006  . FIBROCYSTIC BREAST DISEASE 05/14/2006    Plan: Hysteroscopy uterine curettage and endometrial ablation  EURE,LUTHER H 03/23/2013 12:43 PM

## 2013-03-27 ENCOUNTER — Encounter (HOSPITAL_COMMUNITY): Payer: Self-pay

## 2013-04-03 ENCOUNTER — Encounter (HOSPITAL_COMMUNITY)
Admission: RE | Admit: 2013-04-03 | Discharge: 2013-04-03 | Disposition: A | Discharge status: Home / Self Care | Payer: 59 | Source: Ambulatory Visit | Attending: Obstetrics & Gynecology | Admitting: Obstetrics & Gynecology

## 2013-04-03 ENCOUNTER — Encounter (HOSPITAL_COMMUNITY): Payer: Self-pay

## 2013-04-03 DIAGNOSIS — Z01812 Encounter for preprocedural laboratory examination: Secondary | ICD-10-CM | POA: Insufficient documentation

## 2013-04-03 DIAGNOSIS — Z01818 Encounter for other preprocedural examination: Secondary | ICD-10-CM | POA: Insufficient documentation

## 2013-04-03 LAB — URINALYSIS, ROUTINE W REFLEX MICROSCOPIC
Glucose, UA: NEGATIVE mg/dL
Leukocytes, UA: NEGATIVE
Protein, ur: NEGATIVE mg/dL
Specific Gravity, Urine: 1.02 (ref 1.005–1.030)
Urobilinogen, UA: 1 mg/dL (ref 0.0–1.0)

## 2013-04-03 LAB — CBC
MCH: 29 pg (ref 26.0–34.0)
MCHC: 34.7 g/dL (ref 30.0–36.0)
MCV: 83.6 fL (ref 78.0–100.0)
Platelets: 369 10*3/uL (ref 150–400)
RBC: 4.27 MIL/uL (ref 3.87–5.11)

## 2013-04-03 LAB — COMPREHENSIVE METABOLIC PANEL
AST: 19 U/L (ref 0–37)
CO2: 26 mEq/L (ref 19–32)
Calcium: 9.4 mg/dL (ref 8.4–10.5)
Creatinine, Ser: 0.99 mg/dL (ref 0.50–1.10)
GFR calc Af Amer: 86 mL/min — ABNORMAL LOW (ref >90)
GFR calc non Af Amer: 75 mL/min — ABNORMAL LOW (ref >90)
Total Protein: 7.4 g/dL (ref 6.0–8.3)

## 2013-04-03 NOTE — Patient Instructions (Addendum)
Kadee Philyaw  04/03/2013   Your procedure is scheduled on:  04/11/2013  Report to [facility] at  615   AM.  Call this number if you have problems the morning of surgery: [phone]   Remember:   Do not eat food or drink liquids after midnight.   Take these medicines the morning of surgery with A SIP OF WATER: none   Do not wear jewelry, make-up or nail polish.  Do not wear lotions, powders, or perfumes.   Do not shave 48 hours prior to surgery. Men may shave face and neck.  Do not bring valuables to the hospital.  [facility] is not responsible  for any belongings or valuables.               Contacts, dentures or bridgework may not be worn into surgery.  Leave suitcase in the car. After surgery it may be brought to your room.  For patients admitted to the hospital, discharge time is determined by your treatment team.               Patients discharged the day of surgery will not be allowed to drive home.  Name and phone number of your driver: family  Special Instructions: Shower using CHG 2 nights before surgery and the night before surgery.  If you shower the day of surgery use CHG.  Use special wash - you have one bottle of CHG for all showers.  You should use approximately 1/3 of the bottle for each shower.   Please read over the following fact sheets that you were given: Pain Booklet, Coughing and Deep Breathing, MRSA Information, Surgical Site Infection Prevention, Anesthesia Post-op Instructions and Care and Recovery After Surgery Dilation and Curettage or Vacuum Curettage Dilation and curettage (D&C) and vacuum curettage are minor procedures. A D&C involves stretching (dilation) the cervix and scraping (curettage) the inside lining of the womb (uterus). During a D&C, tissue is gently scraped from the inside lining of the uterus. During a vacuum curettage, the lining and tissue in the uterus are removed with the use of gentle suction.  Curettage may be performed to either  diagnose or treat a problem. As a diagnostic procedure, curettage is performed to examine tissues from the uterus. A diagnostic curettage may be performed for the following symptoms:   Irregular bleeding in the uterus.   Bleeding with the development of clots.   Spotting between menstrual periods.   Prolonged menstrual periods.   Bleeding after menopause.   No menstrual period (amenorrhea).   A change in size and shape of the uterus.  As a treatment procedure, curettage may be performed for the following reasons:   Removal of an IUD (intrauterine device).   Removal of retained placenta after giving birth. Retained placenta can cause an infection or bleeding severe enough to require transfusions.   Abortion.   Miscarriage.   Removal of polyps inside the uterus.   Removal of uncommon types of noncancerous lumps (fibroids).  LET [facility] CARE PROVIDER KNOW ABOUT:   Any allergies you have.   All medicines you are taking, including vitamins, herbs, eye drops, creams, and over-the-counter medicines.   Previous problems you or members of your family have had with the use of anesthetics.   Any blood disorders you have.   Previous surgeries you have had.   Medical conditions you have. RISKS AND COMPLICATIONS  Generally, this is a safe procedure. However, as with any procedure, complications can  occur. Possible complications include:  Excessive bleeding.   Infection of the uterus.   Damage to the cervix.   Development of scar tissue (adhesions) inside the uterus, later causing abnormal amounts of menstrual bleeding.   Complications from the general anesthetic, if a general anesthetic is used.   Putting a hole (perforation) in the uterus. This is rare.  BEFORE THE PROCEDURE   Eat and drink before the procedure only as directed by your health care provider.   Arrange for someone to take you home.  PROCEDURE  This procedure usually takes  about 15 30 minutes.  You will be given one of the following:  A medicine that numbs the area in and around the cervix (local anesthetic).   A medicine to make you sleep through the procedure (general anesthetic).  You will lie on your back with your legs in stirrups.   A warm metal or plastic instrument (speculum) will be placed in your vagina to keep it open and to allow the health care provider to see the cervix.  There are two ways in which your cervix can be softened and dilated. These include:   Taking a medicine.   Having thin rods (laminaria) inserted into your cervix.   A curved tool (curette) will be used to scrape cells from the inside lining of the uterus. In some cases, gentle suction is applied with the curette. The curette will then be removed.  AFTER THE PROCEDURE   You will rest in the recovery area until you are stable and are ready to go home.   You may feel sick to your stomach (nauseous) or throw up (vomit) if you were given a general anesthetic.   You may have a sore throat if a tube was placed in your throat during general anesthesia.   You may have light cramping and bleeding. This may last for 2 days to 2 weeks after the procedure.   Your uterus needs to make a new lining after the procedure. This may make your next period late. Document Released: 04/26/2005 Document Revised: 12/27/2012 Document Reviewed: 11/23/2012 [facility] Patient Information 2014 [place], [place]. PATIENT INSTRUCTIONS POST-ANESTHESIA  IMMEDIATELY FOLLOWING SURGERY:  Do not drive or operate machinery for the first twenty four hours after surgery.  Do not make any important decisions for twenty four hours after surgery or while taking narcotic pain medications or sedatives.  If you develop intractable nausea and vomiting or a severe headache please notify your doctor immediately.  FOLLOW-UP:  Please make an appointment with your surgeon as instructed. You do not need to follow  up with anesthesia unless specifically instructed to do so.  WOUND CARE INSTRUCTIONS (if applicable):  Keep a dry clean dressing on the anesthesia/puncture wound site if there is drainage.  Once the wound has quit draining you may leave it open to air.  Generally you should leave the bandage intact for twenty four hours unless there is drainage.  If the epidural site drains for more than 36-48 hours please call the anesthesia department.  QUESTIONS?:  Please feel free to call your physician or the hospital operator if you have any questions, and they will be happy to assist you.

## 2013-04-04 ENCOUNTER — Inpatient Hospital Stay (HOSPITAL_COMMUNITY)
Admission: RE | Admit: 2013-04-04 | Discharge: 2013-04-04 | Disposition: A | Discharge status: Home / Self Care | Payer: 59 | Source: Ambulatory Visit

## 2013-04-11 ENCOUNTER — Ambulatory Visit (HOSPITAL_COMMUNITY): Payer: 59 | Admitting: Anesthesiology

## 2013-04-11 ENCOUNTER — Encounter (HOSPITAL_COMMUNITY)
Admission: RE | Disposition: A | Discharge status: Home / Self Care | Payer: Self-pay | Source: Ambulatory Visit | Attending: Obstetrics & Gynecology

## 2013-04-11 ENCOUNTER — Encounter (HOSPITAL_COMMUNITY): Payer: 59 | Admitting: Anesthesiology

## 2013-04-11 ENCOUNTER — Encounter (HOSPITAL_COMMUNITY): Payer: Self-pay | Admitting: *Deleted

## 2013-04-11 ENCOUNTER — Ambulatory Visit (HOSPITAL_COMMUNITY)
Admission: RE | Admit: 2013-04-11 | Discharge: 2013-04-11 | Disposition: A | Discharge status: Home / Self Care | Payer: 59 | Source: Ambulatory Visit | Attending: Obstetrics & Gynecology | Admitting: Obstetrics & Gynecology

## 2013-04-11 DIAGNOSIS — Z9889 Other specified postprocedural states: Secondary | ICD-10-CM

## 2013-04-11 DIAGNOSIS — N920 Excessive and frequent menstruation with regular cycle: Secondary | ICD-10-CM

## 2013-04-11 DIAGNOSIS — N946 Dysmenorrhea, unspecified: Secondary | ICD-10-CM

## 2013-04-11 HISTORY — PX: DILITATION & CURRETTAGE/HYSTROSCOPY WITH THERMACHOICE ABLATION: SHX5569

## 2013-04-11 SURGERY — DILATATION & CURETTAGE/HYSTEROSCOPY WITH THERMACHOICE ABLATION
Anesthesia: General

## 2013-04-11 MED ORDER — MIDAZOLAM HCL 2 MG/2ML IJ SOLN
INTRAMUSCULAR | Status: AC
  Filled 2013-04-11: qty 2

## 2013-04-11 MED ORDER — KETOROLAC TROMETHAMINE 30 MG/ML IJ SOLN
INTRAMUSCULAR | Status: AC
  Filled 2013-04-11: qty 1

## 2013-04-11 MED ORDER — PROPOFOL 10 MG/ML IV BOLUS
INTRAVENOUS | Status: DC | PRN
  Administered 2013-04-11: 125 mg via INTRAVENOUS

## 2013-04-11 MED ORDER — MIDAZOLAM HCL 2 MG/2ML IJ SOLN
1.0000 mg | INTRAMUSCULAR | Status: DC | PRN
  Administered 2013-04-11: 2 mg via INTRAVENOUS

## 2013-04-11 MED ORDER — FENTANYL CITRATE 0.05 MG/ML IJ SOLN
INTRAMUSCULAR | Status: AC
  Administered 2013-04-11: 25 ug via INTRAVENOUS
  Filled 2013-04-11: qty 2

## 2013-04-11 MED ORDER — FENTANYL CITRATE 0.05 MG/ML IJ SOLN
25.0000 ug | INTRAMUSCULAR | Status: DC | PRN
  Administered 2013-04-11 (×2): 50 ug via INTRAVENOUS
  Filled 2013-04-11: qty 2

## 2013-04-11 MED ORDER — LIDOCAINE HCL (PF) 1 % IJ SOLN
INTRAMUSCULAR | Status: AC
  Filled 2013-04-11: qty 5

## 2013-04-11 MED ORDER — DEXTROSE 5 % IV SOLN
INTRAVENOUS | Status: DC | PRN
  Administered 2013-04-11: 20 mL

## 2013-04-11 MED ORDER — CEFAZOLIN SODIUM-DEXTROSE 2-3 GM-% IV SOLR
2.0000 g | INTRAVENOUS | Status: AC
  Administered 2013-04-11: 2 g via INTRAVENOUS

## 2013-04-11 MED ORDER — ONDANSETRON HCL 4 MG/2ML IJ SOLN
4.0000 mg | Freq: Once | INTRAMUSCULAR | Status: AC
  Administered 2013-04-11: 4 mg via INTRAVENOUS

## 2013-04-11 MED ORDER — ONDANSETRON HCL 4 MG/2ML IJ SOLN: 4.0000 mg | Freq: Once | INTRAMUSCULAR | Status: DC | PRN

## 2013-04-11 MED ORDER — LIDOCAINE HCL 1 % IJ SOLN
INTRAMUSCULAR | Status: DC | PRN
  Administered 2013-04-11: 30 mg via INTRADERMAL

## 2013-04-11 MED ORDER — ONDANSETRON HCL 4 MG/2ML IJ SOLN
INTRAMUSCULAR | Status: AC
  Filled 2013-04-11: qty 2

## 2013-04-11 MED ORDER — PROPOFOL 10 MG/ML IV EMUL
INTRAVENOUS | Status: AC
  Filled 2013-04-11: qty 20

## 2013-04-11 MED ORDER — LACTATED RINGERS IV SOLN
INTRAVENOUS | Status: DC
  Administered 2013-04-11: 1000 mL via INTRAVENOUS

## 2013-04-11 MED ORDER — FENTANYL CITRATE 0.05 MG/ML IJ SOLN
25.0000 ug | INTRAMUSCULAR | Status: AC
  Administered 2013-04-11 (×2): 25 ug via INTRAVENOUS

## 2013-04-11 MED ORDER — FENTANYL CITRATE 0.05 MG/ML IJ SOLN
INTRAMUSCULAR | Status: DC | PRN
  Administered 2013-04-11 (×3): 25 ug via INTRAVENOUS

## 2013-04-11 MED ORDER — SEVOFLURANE IN SOLN
RESPIRATORY_TRACT | Status: AC
  Filled 2013-04-11: qty 250

## 2013-04-11 MED ORDER — ONDANSETRON HCL 8 MG PO TABS: 8.0000 mg | ORAL_TABLET | Freq: Three times a day (TID) | ORAL | Status: DC | PRN

## 2013-04-11 MED ORDER — KETOROLAC TROMETHAMINE 30 MG/ML IJ SOLN
30.0000 mg | Freq: Once | INTRAMUSCULAR | Status: AC
  Administered 2013-04-11: 30 mg via INTRAVENOUS

## 2013-04-11 MED ORDER — FENTANYL CITRATE 0.05 MG/ML IJ SOLN
INTRAMUSCULAR | Status: AC
  Filled 2013-04-11: qty 5

## 2013-04-11 MED ORDER — CEFAZOLIN SODIUM-DEXTROSE 2-3 GM-% IV SOLR
INTRAVENOUS | Status: AC
  Filled 2013-04-11: qty 50

## 2013-04-11 MED ORDER — HYDROCODONE-ACETAMINOPHEN 5-325 MG PO TABS: 1.0000 | ORAL_TABLET | Freq: Four times a day (QID) | ORAL | Status: DC | PRN

## 2013-04-11 MED ORDER — SODIUM CHLORIDE 0.9 % IR SOLN
Status: DC | PRN
  Administered 2013-04-11: 1000 mL

## 2013-04-11 MED ORDER — KETOROLAC TROMETHAMINE 10 MG PO TABS: 10.0000 mg | ORAL_TABLET | Freq: Three times a day (TID) | ORAL | Status: DC | PRN

## 2013-04-11 SURGICAL SUPPLY — 35 items
BAG DECANTER FOR FLEXI CONT (MISCELLANEOUS) ×2 IMPLANT
BAG HAMPER (MISCELLANEOUS) ×2 IMPLANT
CATH THERMACHOICE III (CATHETERS) ×2 IMPLANT
CLOTH BEACON ORANGE TIMEOUT ST (SAFETY) ×2 IMPLANT
COVER LIGHT HANDLE STERIS (MISCELLANEOUS) ×4 IMPLANT
COVER MAYO STAND XLG (DRAPE) ×2 IMPLANT
FORMALIN 10 PREFIL 120ML (MISCELLANEOUS) ×2 IMPLANT
GAUZE SPONGE 4X4 16PLY XRAY LF (GAUZE/BANDAGES/DRESSINGS) ×2 IMPLANT
GLOVE BIOGEL PI IND STRL 7.0 (GLOVE) ×1 IMPLANT
GLOVE BIOGEL PI IND STRL 8 (GLOVE) ×1 IMPLANT
GLOVE BIOGEL PI INDICATOR 7.0 (GLOVE) ×1
GLOVE BIOGEL PI INDICATOR 8 (GLOVE) ×1
GLOVE ECLIPSE 8.0 STRL XLNG CF (GLOVE) ×2 IMPLANT
GLOVE EXAM NITRILE MD LF STRL (GLOVE) ×2 IMPLANT
GLOVE SS BIOGEL STRL SZ 6.5 (GLOVE) ×1 IMPLANT
GLOVE SUPERSENSE BIOGEL SZ 6.5 (GLOVE) ×1
GOWN STRL REIN XL XLG (GOWN DISPOSABLE) ×4 IMPLANT
INST SET HYSTEROSCOPY (KITS) ×2 IMPLANT
IV D5W 500ML (IV SOLUTION) ×2 IMPLANT
IV NS 1000ML (IV SOLUTION) ×2
IV NS 1000ML BAXH (IV SOLUTION) ×1 IMPLANT
IV NS IRRIG 3000ML ARTHROMATIC (IV SOLUTION) IMPLANT
KIT ROOM TURNOVER AP CYSTO (KITS) ×2 IMPLANT
MANIFOLD NEPTUNE II (INSTRUMENTS) ×2 IMPLANT
MARKER SKIN DUAL TIP RULER LAB (MISCELLANEOUS) ×2 IMPLANT
NS IRRIG 1000ML POUR BTL (IV SOLUTION) IMPLANT
PACK BASIC III (CUSTOM PROCEDURE TRAY) ×2
PACK SRG BSC III STRL LF ECLPS (CUSTOM PROCEDURE TRAY) ×1 IMPLANT
PAD ARMBOARD 7.5X6 YLW CONV (MISCELLANEOUS) ×2 IMPLANT
PAD TELFA 3X4 1S STER (GAUZE/BANDAGES/DRESSINGS) ×2 IMPLANT
PENCIL HANDSWITCHING (ELECTRODE) ×2 IMPLANT
SET BASIN LINEN APH (SET/KITS/TRAYS/PACK) ×2 IMPLANT
SET IRRIG Y TYPE TUR BLADDER L (SET/KITS/TRAYS/PACK) ×2 IMPLANT
SHEET LAVH (DRAPES) ×2 IMPLANT
YANKAUER SUCT BULB TIP 10FT TU (MISCELLANEOUS) ×2 IMPLANT

## 2013-04-11 NOTE — Op Note (Signed)
Preoperative diagnosis: Menometrorrhagia                                        Dysmenorrhea   Postoperative diagnoses: Same as above   Procedure: Hysteroscopy, uterine curettage, endometrial ablation  Surgeon: Lazaro Arms   Anesthesia: Laryngeal mask airway  Findings: The endometrium was normal. There were no fibroid or other abnormalities.  Description of operation: The patient was taken to the operating room and placed in the supine position. She underwent general anesthesia using the laryngeal mask airway. She was placed in the dorsal lithotomy position and prepped and draped in the usual sterile fashion. A Graves speculum was placed and the anterior cervical lip was grasped with a single-tooth tenaculum. The cervix was dilated serially to allow passage of the hysteroscope. Diagnostic hysteroscopy was performed and was found to be normal. A vigorous uterine curettage was then performed and all tissue sent to pathology for evaluation. The ThermaChoice 3 endometrial ablation balloon was then used were 20 cc of D5W was required to maintain a pressure of 190-200 mm of mercury throughout the procedure. Toatl therapy time was 9:38.  All of the equipment worked well throughout the procedure. All of the fluid was returned at the end of the procedure. The patient was awakened from anesthesia and taken to the recovery room in good stable condition all counts were correct. She received 2 g of Ancef and 30 mg of Toradol preoperatively. She will be discharged from the recovery room and followed up in the office in 1- 2 weeks.  [name] H 04/11/2013 8:20 AM

## 2013-04-11 NOTE — Anesthesia Postprocedure Evaluation (Addendum)
  Anesthesia Post-op Note  Patient: [name]  Procedure(s) Performed: Procedure(s) with comments: DILATATION & CURETTAGE/HYSTEROSCOPY WITH THERMACHOICE ABLATION (N/A) - Total Ablation Therapy Time = 9 minutes 38 seconds; 86-88 deg C  Patient Location: PACU  Anesthesia Type:General  Level of Consciousness: awake, alert  and oriented  Airway and Oxygen Therapy: Patient Spontanous Breathing  Post-op Pain: none  Post-op Assessment: Post-op Vital signs reviewed, Patient's Cardiovascular Status Stable, Respiratory Function Stable, Patent Airway and No signs of Nausea or vomiting  Post-op Vital Signs: Reviewed and stable 36.8, 133/84, 69,14,100 sat  Complications: No apparent anesthesia complications

## 2013-04-11 NOTE — H&P (Signed)
Patient ID: [name], female DOB: 1980-06-24, 32 y.o. MRN: [id number]  Preoperative History and Physical  [name] is a 32 y.o. G2P2 with Patient's last menstrual period was 03/22/2013. admitted for a hysteroscopy uterine curettage and endometrial ablation for menometrorrhagia, dysmenorrhea with normal sonogram.  Did not respond to oral contraceptives  PMH:  Past Medical History   Diagnosis  Date   .  Heart murmur    .  Menorrhagia  03/06/2013   PSH:  Past Surgical History   Procedure  Laterality  Date   .  Cesarean section       x2   .  Laparoscopic incisional / umbilical / ventral hernia repair   09/2010     Dr. Lovell Sheehan   .  Tubal ligation     POb/GynH:  OB History    Grav  Para  Term  Preterm  Abortions  TAB  SAB  Ect  Mult  Living    2  2         2      SH:  History   Substance Use Topics   .  Smoking status:  Former Smoker     Types:  Cigarettes   .  Smokeless tobacco:  Never Used   .  Alcohol Use:  Yes      Comment: occas   FH:  Family History   Problem  Relation  Age of Onset   .  Cancer  Paternal Uncle      unknown   .  Cancer  Paternal Grandmother      unknown   .  Diabetes  Maternal Grandfather    .  Alcohol abuse  Paternal Grandfather    Allergies: No Known Allergies  Medications: Current outpatient prescriptions:Multiple Vitamin (MULITIVITAMIN WITH MINERALS) TABS, Take 1 tablet by mouth daily., Disp: , Rfl: ; norgestimate-ethinyl estradiol (ORTHO-CYCLEN,SPRINTEC,PREVIFEM) 0.25-35 MG-MCG tablet, Take 1 tablet by mouth daily., Disp: , Rfl:  Review of Systems:  Review of Systems  Constitutional: Negative for fever, chills, weight loss, malaise/fatigue and diaphoresis.  HENT: Negative for hearing loss, ear pain, nosebleeds, congestion, sore throat, neck pain, tinnitus and ear discharge.  Eyes: Negative for blurred vision, double vision, photophobia, pain, discharge and redness.  Respiratory: Negative for cough, hemoptysis, sputum production, shortness  of breath, wheezing and stridor.  Cardiovascular: Negative for chest pain, palpitations, orthopnea, claudication, leg swelling and PND.  Gastrointestinal: Positive for abdominal pain. Negative for heartburn, nausea, vomiting, diarrhea, constipation, blood in stool and melena.  Genitourinary: Negative for dysuria, urgency, frequency, hematuria and flank pain.  Musculoskeletal: Negative for myalgias, back pain, joint pain and falls.  Skin: Negative for itching and rash.  Neurological: Negative for dizziness, tingling, tremors, sensory change, speech change, focal weakness, seizures, loss of consciousness, weakness and headaches.  Endo/Heme/Allergies: Negative for environmental allergies and polydipsia. Does not bruise/bleed easily.  Psychiatric/Behavioral: Negative for depression, suicidal ideas, hallucinations, memory loss and substance abuse. The patient is not nervous/anxious and does not have insomnia.  PHYSICAL EXAM:  Blood pressure 120/80, height 5\' 5"  (1.651 m), weight 203 lb (92.08 kg), last menstrual period 03/22/2013.  Vitals reviewed.  Constitutional: She is oriented to person, place, and time. She appears well-developed and well-nourished.  HENT:  Head: Normocephalic and atraumatic.  Right Ear: External ear normal.  Left Ear: External ear normal.  Nose: Nose normal.  Mouth/Throat: Oropharynx is clear and moist.  Eyes: Conjunctivae and EOM are normal. Pupils are equal, round, and reactive to light. Right eye exhibits no  discharge. Left eye exhibits no discharge. No scleral icterus.  Neck: Normal range of motion. Neck supple. No tracheal deviation present. No thyromegaly present.  Cardiovascular: Normal rate, regular rhythm, normal heart sounds and intact distal pulses. Exam reveals no gallop and no friction rub.  No murmur heard.  Respiratory: Effort normal and breath sounds normal. No respiratory distress. She has no wheezes. She has no rales. She exhibits no tenderness.  GI: Soft.  Bowel sounds are normal. She exhibits no distension and no mass. There is tenderness. There is no rebound and no guarding.  Genitourinary:  Vulva is normal without lesions Vagina is pink moist without discharge Cervix normal in appearance and pap is normal Uterus is enlarged to 16-18 weeks size due to multiple fibroids the largest of which is 6.1 cm Adnexa is negative with normal sized ovaries by sonogram  Musculoskeletal: Normal range of motion. She exhibits no edema and no tenderness.  Neurological: She is alert and oriented to person, place, and time. She has normal reflexes. She displays normal reflexes. No cranial nerve deficit. She exhibits normal muscle tone. Coordination normal.  Skin: Skin is warm and dry. No rash noted. No erythema. No pallor.  Psychiatric: She has a normal mood and affect. Her behavior is normal. Judgment and thought content normal.  Labs:  No results found for this or any previous visit (from the past 336 hour(s)).  EKG:  Orders placed during the hospital encounter of 12/13/12   .  EKG 12-LEAD   .  EKG 12-LEAD   .  EKG   Imaging Studies:  US Transvaginal Non-ob  03/16/2013 GYNECOLOGIC SONOGRAM Shazia Mitchener is a 32 y.o. G2P2 LMP 02/21/2013 for a pelvic sonogram for menorrhagia. Pt s/p c/s x2 and BTL. Uterus 8.7 x 6.2 x 5.1 cm, anteverted uterus no myometrial masses noted, pt very tender to palpation to uterus with vaginal probe Endometrium 5.1 mm, symmetrical, although endometrial cavity/tissue seems to extend into prior c/s site Right ovary 3.0 x 1.9 x 1.8 cm, Left ovary 3.6 x 2.8 x 2.5 cm, slightly tender to palpation with vaginal probe No free fluid or adnexal masses noted within pelvis Technician Comments: No myometrial or adnexal masses noted within pelvis, pt very tender to palpation to uterus with vaginal probe, endometrial cavity seems to extend into previous c/s site/scar Chari Manning 03/16/2013 10:26 AM  Assessment:  Patient Active Problem List     Diagnosis  Date Noted   .  Excessive or frequent menstruation  03/23/2013   .  Dysmenorrhea  03/23/2013   .  Menorrhagia  03/06/2013   .  Abdominal muscle strain  03/06/2012   .  Diastasis recti  03/06/2012   .  Tobacco abuse  03/06/2012   .  TONSILLITIS  05/15/2008   .  AMENORRHEA  11/30/2006   .  SYMPTOM, HEADACHE  11/30/2006   .  PELVIC PAIN  11/30/2006   .  ELEVATED BLOOD PRESSURE  11/30/2006   .  FIBROCYSTIC BREAST DISEASE  05/14/2006   Plan:  Hysteroscopy uterine curettage and endometrial ablation    EURE,LUTHER H 04/11/2013 7:11 AM

## 2013-04-11 NOTE — Anesthesia Procedure Notes (Addendum)
Procedure Name: LMA Insertion Date/Time: 04/11/2013 7:40 AM Performed by: Glynn Octave E Pre-anesthesia Checklist: Patient identified, Patient being monitored, Emergency Drugs available, Timeout performed and Suction available Patient Re-evaluated:Patient Re-evaluated prior to inductionOxygen Delivery Method: Circle System Utilized Preoxygenation: Pre-oxygenation with 100% oxygen Intubation Type: IV induction Ventilation: Mask ventilation without difficulty LMA: LMA inserted LMA Size: 3.0 Number of attempts: 1 Placement Confirmation: positive ETCO2 and breath sounds checked- equal and bilateral

## 2013-04-11 NOTE — Transfer of Care (Signed)
Immediate Anesthesia Transfer of Care Note  Patient: [name]  Procedure(s) Performed: Procedure(s) with comments: DILATATION & CURETTAGE/HYSTEROSCOPY WITH THERMACHOICE ABLATION (N/A) - Total Ablation Therapy Time = 9 minutes 38 seconds; 86-88 deg C  Patient Location: PACU  Anesthesia Type:General  Level of Consciousness: awake, alert  and oriented  Airway & Oxygen Therapy: Patient Spontanous Breathing and Patient connected to face mask oxygen  Post-op Assessment: Report given to PACU RN  Post vital signs: Reviewed and stable  Complications: No apparent anesthesia complications

## 2013-04-11 NOTE — Anesthesia Preprocedure Evaluation (Signed)
Anesthesia Evaluation  Patient identified by MRN, date of birth, ID band Patient awake    Reviewed: Allergy & Precautions, H&P , NPO status , Patient's Chart, lab work & pertinent test results  Airway Mallampati: I TM Distance: >3 FB Neck ROM: Full    Dental  (+) Teeth Intact   Pulmonary former smoker,    Pulmonary exam normal       Cardiovascular negative cardio ROS  Rhythm:Regular Rate:Normal     Neuro/Psych  Headaches,    GI/Hepatic negative GI ROS,   Endo/Other    Renal/GU      Musculoskeletal   Abdominal   Peds  Hematology   Anesthesia Other Findings   Reproductive/Obstetrics                           Anesthesia Physical Anesthesia Plan  ASA: I  Anesthesia Plan: General   Post-op Pain Management:    Induction: Intravenous  Airway Management Planned: LMA  Additional Equipment:   Intra-op Plan:   Post-operative Plan: Extubation in OR  Informed Consent: I have reviewed the patients History and Physical, chart, labs and discussed the procedure including the risks, benefits and alternatives for the proposed anesthesia with the patient or authorized representative who has indicated his/her understanding and acceptance.     Plan Discussed with:   Anesthesia Plan Comments:         Anesthesia Quick Evaluation

## 2013-04-13 ENCOUNTER — Encounter (HOSPITAL_COMMUNITY): Payer: Self-pay | Admitting: Obstetrics & Gynecology

## 2013-04-20 ENCOUNTER — Encounter: Payer: Self-pay | Admitting: Obstetrics & Gynecology

## 2013-04-20 ENCOUNTER — Ambulatory Visit (INDEPENDENT_AMBULATORY_CARE_PROVIDER_SITE_OTHER): Payer: 59 | Admitting: Obstetrics & Gynecology

## 2013-04-20 VITALS — BP 130/80 | Wt 201.0 lb

## 2013-04-20 DIAGNOSIS — Z9889 Other specified postprocedural states: Secondary | ICD-10-CM

## 2013-04-20 NOTE — Progress Notes (Signed)
Patient ID: [name], female   DOB: 1980/10/25, 32 y.o.   MRN: [id number] POD #9 hysteroscopy uterine curettage endo ablation Went well 20cc balloon volume  No complaints blood tinged watery discharge  Exam Normal Appropriate  Follow up in 1 year

## 2013-12-06 ENCOUNTER — Ambulatory Visit (INDEPENDENT_AMBULATORY_CARE_PROVIDER_SITE_OTHER): Payer: BC Managed Care – PPO | Admitting: Nurse Practitioner

## 2013-12-06 ENCOUNTER — Encounter: Payer: Self-pay | Admitting: Nurse Practitioner

## 2013-12-06 VITALS — BP 112/80 | Ht 65.25 in | Wt 201.4 lb

## 2013-12-06 DIAGNOSIS — R609 Edema, unspecified: Secondary | ICD-10-CM

## 2013-12-06 DIAGNOSIS — R5383 Other fatigue: Secondary | ICD-10-CM

## 2013-12-06 DIAGNOSIS — G43519 Persistent migraine aura without cerebral infarction, intractable, without status migrainosus: Secondary | ICD-10-CM

## 2013-12-06 DIAGNOSIS — R5381 Other malaise: Secondary | ICD-10-CM

## 2013-12-06 DIAGNOSIS — Z79899 Other long term (current) drug therapy: Secondary | ICD-10-CM

## 2013-12-06 DIAGNOSIS — Z0189 Encounter for other specified special examinations: Secondary | ICD-10-CM

## 2013-12-06 MED ORDER — IBUPROFEN 800 MG PO TABS: 800.0000 mg | ORAL_TABLET | Freq: Three times a day (TID) | ORAL | Status: DC | PRN

## 2013-12-06 MED ORDER — HYDROCHLOROTHIAZIDE 25 MG PO TABS: 25.0000 mg | ORAL_TABLET | Freq: Every day | ORAL | Status: DC

## 2013-12-06 MED ORDER — ALPRAZOLAM 0.5 MG PO TABS: 0.5000 mg | ORAL_TABLET | Freq: Every evening | ORAL | Status: DC | PRN

## 2013-12-07 LAB — BASIC METABOLIC PANEL
BUN: 13 mg/dL (ref 6–23)
CALCIUM: 9.1 mg/dL (ref 8.4–10.5)
CO2: 25 mEq/L (ref 19–32)
Chloride: 102 mEq/L (ref 96–112)
Creat: 0.79 mg/dL (ref 0.50–1.10)
Glucose, Bld: 70 mg/dL (ref 70–99)
POTASSIUM: 4.1 meq/L (ref 3.5–5.3)
SODIUM: 136 meq/L (ref 135–145)

## 2013-12-07 LAB — CBC WITH DIFFERENTIAL/PLATELET
Basophils Absolute: 0 10*3/uL (ref 0.0–0.1)
Basophils Relative: 0 % (ref 0–1)
EOS ABS: 0.1 10*3/uL (ref 0.0–0.7)
Eosinophils Relative: 1 % (ref 0–5)
HCT: 38.2 % (ref 36.0–46.0)
HEMOGLOBIN: 13.1 g/dL (ref 12.0–15.0)
LYMPHS ABS: 2.3 10*3/uL (ref 0.7–4.0)
LYMPHS PCT: 37 % (ref 12–46)
MCH: 28.6 pg (ref 26.0–34.0)
MCHC: 34.3 g/dL (ref 30.0–36.0)
MCV: 83.4 fL (ref 78.0–100.0)
Monocytes Absolute: 0.4 10*3/uL (ref 0.1–1.0)
Monocytes Relative: 6 % (ref 3–12)
NEUTROS PCT: 56 % (ref 43–77)
Neutro Abs: 3.4 10*3/uL (ref 1.7–7.7)
Platelets: 300 10*3/uL (ref 150–400)
RBC: 4.58 MIL/uL (ref 3.87–5.11)
RDW: 13.8 % (ref 11.5–15.5)
WBC: 6.1 10*3/uL (ref 4.0–10.5)

## 2013-12-07 LAB — HEPATIC FUNCTION PANEL
ALBUMIN: 3.9 g/dL (ref 3.5–5.2)
ALT: 16 U/L (ref 0–35)
AST: 16 U/L (ref 0–37)
Alkaline Phosphatase: 70 U/L (ref 39–117)
Bilirubin, Direct: 0.1 mg/dL (ref 0.0–0.3)
Indirect Bilirubin: 0.3 mg/dL (ref 0.2–1.2)
TOTAL PROTEIN: 6.9 g/dL (ref 6.0–8.3)
Total Bilirubin: 0.4 mg/dL (ref 0.2–1.2)

## 2013-12-07 LAB — LIPID PANEL
CHOL/HDL RATIO: 2.4 ratio
CHOLESTEROL: 145 mg/dL (ref 0–200)
HDL: 60 mg/dL (ref >39)
LDL Cholesterol: 74 mg/dL (ref 0–99)
Triglycerides: 53 mg/dL (ref <150)
VLDL: 11 mg/dL (ref 0–40)

## 2013-12-07 LAB — TSH: TSH: 0.564 u[IU]/mL (ref 0.350–4.500)

## 2013-12-08 LAB — VITAMIN D 25 HYDROXY (VIT D DEFICIENCY, FRACTURES): VIT D 25 HYDROXY: 29 ng/mL — AB (ref 30–89)

## 2013-12-11 ENCOUNTER — Encounter: Payer: Self-pay | Admitting: Nurse Practitioner

## 2013-12-11 NOTE — Progress Notes (Signed)
Subjective:  Presents for several complaints. Began having increased swelling in her feet that began in April. No change in her salt intake or diet. Drinks a lot of water. Works third shift and a hot environment. No chest pain shortness of breath orthopnea. No unusual cough. Averages about 5 hours of sleep during the day which usually is broken up. Having headaches when she gets up in the afternoons, describes as a throbbing headache in the frontal area. No visual changes. No photosensitivity. 7-8/10 on pain scale. Will then have nausea all day. Some relief with 800 mg ibuprofen which she is now taking daily. No numbness or weakness of the face arms or legs. No difficulty speaking or swallowing. Has not identified any specific triggers. No known family history of migraines.  Objective:   BP 112/80  Ht 5' 5.25" (1.657 m)  Wt 201 lb 6.4 oz (91.354 kg)  BMI 33.27 kg/m2 NAD. Alert, oriented. TMs minimal clear effusion, no erythema. Pharynx clear. Neck supple with mild soft anterior adenopathy. Lungs clear. Heart regular rate rhythm. Lower extremities trace pitting edema. Strong pedal pulses. Toes warm with good capillary refill. Mild faint superficial varicose veins noted.  Assessment: Migraine aura, persistent, intractable  Edema - Plan: Basic metabolic panel  Other malaise and fatigue - Plan: CBC with Differential, TSH, Vit D  25 hydroxy (rtn osteoporosis monitoring)  Encounter for long-term (current) use of other medications - Plan: Hepatic function panel, Basic metabolic panel  Other specified examination - Plan: Lipid panel  Morbid obesity  Plan:  Meds ordered this encounter  Medications  . hydrochlorothiazide (HYDRODIURIL) 25 MG tablet    Sig: Take 1 tablet (25 mg total) by mouth daily. Prn swelling    Dispense:  30 tablet    Refill:  5    Order Specific Question:  Supervising Provider    Answer:  Merlyn AlbertLUKING, WILLIAM S [[id number]]  . ibuprofen (ADVIL,MOTRIN) 800 MG tablet    Sig: Take 1  tablet (800 mg total) by mouth every 8 (eight) hours as needed.    Dispense:  30 tablet    Refill:  0    Order Specific Question:  Supervising Provider    Answer:  Merlyn AlbertLUKING, WILLIAM S [[id number]]  . ALPRAZolam (XANAX) 0.5 MG tablet    Sig: Take 1 tablet (0.5 mg total) by mouth at bedtime as needed for sleep.    Dispense:  30 tablet    Refill:  2    Order Specific Question:  Supervising Provider    Answer:  Merlyn AlbertLUKING, WILLIAM S [[id number]]    Discussed importance of adequate uninterrupted rest. Also probable element of rebound/analgesic induced headache. To keep headache diary and bring to next visit. Warning signs reviewed. Call or go to ED sooner if any problems. Discussed importance of weight loss. Return in about 2 weeks (around 12/20/2013).

## 2013-12-20 ENCOUNTER — Encounter: Payer: Self-pay | Admitting: Nurse Practitioner

## 2013-12-20 ENCOUNTER — Ambulatory Visit (INDEPENDENT_AMBULATORY_CARE_PROVIDER_SITE_OTHER): Payer: BC Managed Care – PPO | Admitting: Nurse Practitioner

## 2013-12-20 VITALS — BP 136/82 | Ht 65.25 in | Wt 202.0 lb

## 2013-12-20 DIAGNOSIS — G43019 Migraine without aura, intractable, without status migrainosus: Secondary | ICD-10-CM

## 2013-12-20 MED ORDER — IBUPROFEN 800 MG PO TABS: 800.0000 mg | ORAL_TABLET | Freq: Three times a day (TID) | ORAL | Status: DC | PRN

## 2013-12-21 ENCOUNTER — Encounter: Payer: Self-pay | Admitting: Nurse Practitioner

## 2013-12-21 NOTE — Progress Notes (Signed)
Subjective:  Presents for recheck on her migraines. Significant improvement since last visit. Having at most 2 headaches per week, was having them every day. 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 Headaches are relieved with occasional use of ibuprofen 800 mg. Filled out her headache diary but did not bring to office today.  Objective:   BP 136/82  Ht 5' 5.25" (1.657 m)  Wt 202 lb (91.627 kg)  BMI 33.37 kg/m2 NAD. Alert, oriented. Cheerful affect. Lungs clear. Heart regular rate rhythm.  Assessment:  Problem List Items Addressed This Visit   None    Visit Diagnoses   Intractable migraine without aura and without status migrainosus    -  Primary    Relevant Medications       ibuprofen (MOTRIN) tablet       Plan:  Meds ordered this encounter  Medications  . ibuprofen (ADVIL,MOTRIN) 800 MG tablet    Sig: Take 1 tablet (800 mg total) by mouth every 8 (eight) hours as needed.    Dispense:  90 tablet    Refill:  0    Order Specific Question:  Supervising Provider    Answer:  Merlyn AlbertLUKING, WILLIAM S [[id number]]   No further medications at this point. Continue current regimen. Call back if any problems. Return in about 6 months (around 06/22/2014).

## 2014-01-29 ENCOUNTER — Ambulatory Visit (INDEPENDENT_AMBULATORY_CARE_PROVIDER_SITE_OTHER): Payer: BC Managed Care – PPO | Admitting: Adult Health

## 2014-01-29 ENCOUNTER — Encounter: Payer: Self-pay | Admitting: Adult Health

## 2014-01-29 VITALS — BP 124/84 | HR 72 | Ht 65.25 in | Wt 204.0 lb

## 2014-01-29 DIAGNOSIS — Z01419 Encounter for gynecological examination (general) (routine) without abnormal findings: Secondary | ICD-10-CM

## 2014-01-29 NOTE — Progress Notes (Signed)
Patient ID: [name], female   DOB: 1981/03/23, 33 y.o.   MRN: [id number] History of Present Illness: Catarina is a 33 year old black female in for a gyn physical, she had a normal pap with negative HPV 06/28/12.She work third shift.   Current Medications, Allergies, Past Medical History, Past Surgical History, Family History and Social History were reviewed in [name] record.     Review of Systems: Patient denies any headaches, blurred vision, shortness of breath, chest pain, abdominal pain, problems with bowel movements, urination, or intercourse. No joint pain or mood swings, she is sp ablation but still spots some and will have occasional cramp.    Physical Exam:BP 124/84  Pulse 72  Ht 5' 5.25" (1.657 m)  Wt 204 lb (92.534 kg)  BMI 33.70 kg/m2 General:  Well developed, well nourished, no acute distress Skin:  Warm and dry Neck:  Midline trachea, normal thyroid Lungs; Clear to auscultation bilaterally Breast:  No dominant palpable mass, retraction, or nipple discharge Cardiovascular: Regular rate and rhythm Abdomen:  Soft, non tender, no hepatosplenomegaly Pelvic:  External genitalia is normal in appearance.  The vagina is normal in appearance. The cervix is smooth.  Uterus is felt to be normal size, shape, and contour.  No adnexal masses or tenderness noted. Extremities:  No swelling or varicosities noted Psych:  No mood changes, alert and cooperative,seems happy   Impression: Yearly gyn exam no pap    Plan: Physical in 1 year Pap in 2017  Mammogram at 40  Labs with PCP

## 2014-01-29 NOTE — Patient Instructions (Signed)
Physical in 1 year Mammogram at 40 Take vitamin 2000IU daily

## 2014-03-11 ENCOUNTER — Encounter: Payer: Self-pay | Admitting: Adult Health

## 2014-03-13 ENCOUNTER — Ambulatory Visit (INDEPENDENT_AMBULATORY_CARE_PROVIDER_SITE_OTHER): Payer: BC Managed Care – PPO

## 2014-03-13 ENCOUNTER — Ambulatory Visit: Payer: BC Managed Care – PPO

## 2014-03-13 DIAGNOSIS — Z23 Encounter for immunization: Secondary | ICD-10-CM

## 2014-05-20 ENCOUNTER — Encounter: Payer: Self-pay | Admitting: Family Medicine

## 2014-05-20 ENCOUNTER — Ambulatory Visit (HOSPITAL_COMMUNITY)
Admission: RE | Admit: 2014-05-20 | Discharge: 2014-05-20 | Disposition: A | Discharge status: Home / Self Care | Payer: BLUE CROSS/BLUE SHIELD | Source: Ambulatory Visit | Attending: Family Medicine | Admitting: Family Medicine

## 2014-05-20 ENCOUNTER — Ambulatory Visit (INDEPENDENT_AMBULATORY_CARE_PROVIDER_SITE_OTHER): Payer: BLUE CROSS/BLUE SHIELD | Admitting: Family Medicine

## 2014-05-20 VITALS — BP 132/88 | Temp 98.2°F | Ht 66.0 in | Wt 204.0 lb

## 2014-05-20 DIAGNOSIS — R51 Headache: Secondary | ICD-10-CM | POA: Diagnosis not present

## 2014-05-20 DIAGNOSIS — R200 Anesthesia of skin: Secondary | ICD-10-CM | POA: Insufficient documentation

## 2014-05-20 DIAGNOSIS — H538 Other visual disturbances: Secondary | ICD-10-CM

## 2014-05-20 DIAGNOSIS — R519 Headache, unspecified: Secondary | ICD-10-CM

## 2014-05-20 DIAGNOSIS — R29810 Facial weakness: Secondary | ICD-10-CM

## 2014-05-20 MED ORDER — HYDROCODONE-ACETAMINOPHEN 5-325 MG PO TABS: 1.0000 | ORAL_TABLET | Freq: Four times a day (QID) | ORAL | Status: DC | PRN

## 2014-05-20 NOTE — Progress Notes (Signed)
   Subjective:    Patient ID: [name], female    DOB: Oct 19, 1980, 34 y.o.   MRN: [id number]  HPIHeadaches ongoing. Has been seen for headaches in the past. Taking ibuprofen 800mg .   Blurry vision started last Thursday. Vision test today. Right eye 20/30. Left eye 20/20. Pt states eyes are watery and burning.  Lips feel numb. Started last thurdsday.   Right arm tingling from elbow down to fingers. Started Thursday.   Non smoker   bp good  .  Patient has had frequent migraines in the past. See prior notes.  Left eye is the main rhinitis irritated right now.  Positive family history of heart disease and stroke but none premature.  Review of Systems No vomiting no loss of consciousness no chest pain no abdominal pain no change in bowel habits    Objective:   Physical Exam Alert no acute distress. Vitals stable. Blood pressure good. Left side of face noticeably weak. Right hand appears to be slightly clumsy. Sensation intact strength intact fine hand coordination questionable. Finger to nose intact legs strength intact reflexes good       Assessment & Plan:  Impression left facial weakness along with right sided plus minus symptomatology. Quite concerning presentation. Discussed. With history of migraines patient is at risk for atypical migraines in even progression to stroke. Discussed. Plan acute MRI and MRA rationale discussed. Urgent neurology referral. Of note MRI and MRA negative which is encouraging, though still need further assessment via neurologist. 35-40 minutes spent on this patient today. WSL

## 2014-05-22 ENCOUNTER — Telehealth: Payer: Self-pay | Admitting: Family Medicine

## 2014-05-22 ENCOUNTER — Encounter: Payer: Self-pay | Admitting: Neurology

## 2014-05-22 ENCOUNTER — Ambulatory Visit (INDEPENDENT_AMBULATORY_CARE_PROVIDER_SITE_OTHER): Payer: BLUE CROSS/BLUE SHIELD | Admitting: Neurology

## 2014-05-22 VITALS — BP 123/79 | HR 65 | Temp 98.4°F | Ht 65.0 in | Wt 205.0 lb

## 2014-05-22 DIAGNOSIS — R519 Headache, unspecified: Secondary | ICD-10-CM

## 2014-05-22 DIAGNOSIS — R51 Headache: Secondary | ICD-10-CM

## 2014-05-22 DIAGNOSIS — G510 Bell's palsy: Secondary | ICD-10-CM

## 2014-05-22 MED ORDER — PREDNISONE 20 MG PO TABS: ORAL_TABLET | ORAL | Status: DC

## 2014-05-22 MED ORDER — VALACYCLOVIR HCL 500 MG PO TABS: ORAL_TABLET | ORAL | Status: DC

## 2014-05-22 NOTE — Telephone Encounter (Signed)
i reviewed the note, and it is good news they think her facial problem is bell's palsy and not a serious brain problem, can definitely ret to work

## 2014-05-22 NOTE — Patient Instructions (Addendum)
You have Bell's Palsy: weakness of your facial muscle due to irritation of your facial nerve on the left. Bell's palsy gets better with time. Continue doing your facial exercises. Use artificial tears to your affected eye during the day as needed and use a lubricant ointment in that eye at night and keep your eye closed at night by using an eye patch. If there is eye pain and redness, you will have to see an eye doctor. The most dangerous complication from Bell's palsy is corneal infection or damage. We will do a brain MRI with contrast and additional blood work and treated you with steroids (prednisone 20 mg: Take 3 pills daily for 7 days, the 2 pills daily for 3 days, then 1 pill daily for 2 days, then 1/2 pill for 2 days, then stop) and an antiviral medication (Valtrex 500 mg: take 1 pill twice daily for 7 days).

## 2014-05-22 NOTE — Telephone Encounter (Signed)
Pt checking back to see if she is supposed to still be out till the 18th after her radiology and neurology Appt, they told her she needed to call us to find out if she needed to stay out until the 18th? She feels she  Is ok to return to work, what after you review all her OV notes from specialist do you recommend?   Please advise

## 2014-05-22 NOTE — Progress Notes (Signed)
Subjective:    Patient ID: [name] is a 34 y.o. female.  HPI     Huston Foley, MD, PhD [facility] [address], [address] [address] [place], [place] [address]  Dear Dr. Gerda Diss,   I saw your patient, [name], upon your kind request in my neurologic clinic today for initial consultation of her recurrent headaches and recent blurry vision. The patient is accompanied by her sister today. As you know, Ms. Rosenkranz is a 34 year old right-handed woman with an underlying medical history of obesity, who reports recurrent headaches for the past year. She has been using ibuprofen 800 mg nearly daily. Her headaches are nearly daily. She reports no visual aura. She has associated nausea, no vomiting, some photophobia. She has no family history of migraines. Some 6 days ago she noticed a revision on the left. She also noticed difficulty sucking with a straw. She feels it is difficult for her to speak. She has some numbness around her lips. She has had some prodromal symptoms a few days prior such as feeling unwell and more headaches. Symptoms are not progressive but are not much better now. Of note, she had a brain MRI without contrast as well as brain MRA without contrast on 05/20/2014, which I reviewed: These were reported as normal. She has no other weakness. She has no other numbness. She did not wake up with these symptoms. She had no fevers or chills and no ear pain.  Her Past Medical History Is Significant For: Past Medical History  Diagnosis Date  . Heart murmur   . Menorrhagia 03/06/2013    Her Past Surgical History Is Significant For: Past Surgical History  Procedure Laterality Date  . Cesarean section      x2,2006,2011  . Laparoscopic incisional / umbilical / ventral hernia repair  09/2010    Dr. Lovell Sheehan  . Tubal ligation  2011  . Dilitation & currettage/hystroscopy with thermachoice ablation N/A 04/11/2013    Procedure: DILATATION &  CURETTAGE/HYSTEROSCOPY WITH THERMACHOICE ABLATION;  Surgeon: Lazaro Arms, MD;  Location: [facility];  Service: Gynecology;  Laterality: N/A;  Total Ablation Therapy Time = 9 minutes 38 seconds; 86-88 deg C  . Hernia repair  2012    Her Family History Is Significant For: Family History  Problem Relation Age of Onset  . Cancer Paternal Uncle     unknown  . Cancer Paternal Grandmother     unknown  . Diabetes Maternal Grandfather   . Alcohol abuse Paternal Grandfather     Her Social History Is Significant For: History   Social History  . Marital Status: Married    Spouse Name: Casimiro Needle    Number of Children: 12  . Years of Education: 12   Occupational History  .      [employer]   Social History Main Topics  . Smoking status: Former Smoker -- 0.25 packs/day for 5 years    Types: Cigarettes    Quit date: 04/03/2008  . Smokeless tobacco: Never Used  . Alcohol Use: 0.0 oz/week    0 Not specified per week     Comment: occ  . Drug Use: No  . Sexual Activity: Yes    Birth Control/ Protection: Surgical   Other Topics Concern  . None   Social History Narrative    Her Allergies Are:  No Known Allergies:   Her Current Medications Are:  Outpatient Encounter Prescriptions as of 05/22/2014  Medication Sig  . ALPRAZolam (XANAX) 0.5 MG tablet Take  1 tablet (0.5 mg total) by mouth at bedtime as needed for sleep.  Marland Kitchen. HYDROcodone-acetaminophen (NORCO/VICODIN) 5-325 MG per tablet Take 1 tablet by mouth every 6 (six) hours as needed.  Marland Kitchen. ibuprofen (ADVIL,MOTRIN) 800 MG tablet Take 1 tablet (800 mg total) by mouth every 8 (eight) hours as needed.  :  Review of Systems:  Out of a complete 14 point review of systems, all are reviewed and negative with the exception of these symptoms as listed below:   Review of Systems  Eyes:       Blurred vision, eye pain  Neurological: Positive for weakness, numbness and headaches.    Objective:  Neurologic Exam  Physical Exam Physical Examination:    Filed Vitals:   05/22/14 0923  BP: 123/79  Pulse: 65  Temp: 98.4 F (36.9 C)    General Examination: The patient is a very pleasant 34 y.o. female in no acute distress. She appears well-developed and well-nourished and well groomed.   HEENT: Normocephalic, atraumatic, pupils are equal, round and reactive to light and accommodation. Funduscopic exam is normal with sharp disc margins noted. Extraocular tracking is good without limitation to gaze excursion or nystagmus noted. Normal smooth pursuit is noted. Hearing is grossly intact. Tympanic membranes are clear bilaterally. No redness or blisters in the ear canal. Face is asymmetric with normal facial animation but left-sided lower motor neuron type weakness. She has a mild left Bell's phenomenon. Visual fields are full by finger perimetry. Visual acuity is mildly decreased on the left. Facial sensation is normal bilaterally. Speech is clear with no dysarthria noted. There is no hypophonia. There is no lip, neck/head, jaw or voice tremor. Neck is supple with full range of passive and active motion. There are no carotid bruits on auscultation. Oropharynx exam reveals: mild mouth dryness, good dental hygiene and mild airway crowding, due to narrow airway entry and tonsillar size of 2-3+ on the left and small on the right. Mallampati is class I. She has mild pharyngeal erythema.  Chest: Clear to auscultation without wheezing, rhonchi or crackles noted.  Heart: S1+S2+0, regular and normal without murmurs, rubs or gallops noted.   Abdomen: Soft, non-tender and non-distended with normal bowel sounds appreciated on auscultation.  Extremities: There is no pitting edema in the distal lower extremities bilaterally. Pedal pulses are intact.  Skin: Warm and dry without trophic changes noted. There are no varicose veins.  Musculoskeletal: exam reveals no obvious joint deformities, tenderness or joint swelling or erythema.   Neurologically:  Mental  status: The patient is awake, alert and oriented in all 4 spheres. Her immediate and remote memory, attention, language skills and fund of knowledge are appropriate. There is no evidence of aphasia, agnosia, apraxia or anomia. Speech is clear with normal prosody and enunciation. Thought process is linear. Mood is normal and affect is normal.  Cranial nerves II - XII are as described above under HEENT exam. In addition: shoulder shrug is normal with equal shoulder height noted. Motor exam: Normal bulk, strength and tone is noted. There is no drift, tremor or rebound. Romberg is negative. Reflexes are 2+ throughout. Babinski: Toes are flexor bilaterally. Fine motor skills and coordination: intact with normal finger taps, normal hand movements, normal rapid alternating patting, normal foot taps and normal foot agility.  Cerebellar testing: No dysmetria or intention tremor on finger to nose testing. Heel to shin is unremarkable bilaterally. There is no truncal or gait ataxia.  Sensory exam: intact to light touch, pinprick, vibration, temperature sense  in the upper and lower extremities.  Gait, station and balance: She stands easily. No veering to one side is noted. No leaning to one side is noted. Posture is age-appropriate and stance is narrow based. Gait shows normal stride length and normal pace. No problems turning are noted. She turns en bloc. Tandem walk is unremarkable.   Assessment and Plan:   In summary, Kamea Dacosta is a very pleasant 34 y.o.-year old female with an underlying medical history of obesity, who reports recurrent headaches for the past year.  she reports a nearly 1 week history of numbness around her lips and difficulty with mouth movements and blurry vision. Her history and physical exam are in keeping with left Bell's palsy. I talked to her and her sister at length about this condition. She may have had a viral syndrome with some prodromal symptoms reported. She is advised to start  symptomatic treatment with steroids and Valtrex. I gave her a prescription for prednisone taper over 2 weeks and Valtrex for one week. She is advised to facial exercises and seek consultation with an ophthalmologist. Furthermore we will go ahead and do a repeat brain MRI with contrast for completion. Her headaches are likely migrainous in nature without aura, and since we are treating with prednisone she may notice some improvement. We will talk about migraine management next time as well. We will do a one-month follow-up for recheck. She is advised not to take high-dose ibuprofen every day. She is advised about potential side effects with prednisone including insomnia and mood related changes at times.   If she has persistent migrainous headaches we will talk about prophylactic treatment in the near future. I answered all her questions today and the patient and her sister were in agreement with the above outlined plan.   Thank you very much for allowing me to participate in the care of this nice patient. If I can be of any further assistance to you please do not hesitate to call me at [phone].  Sincerely,   Huston Foley, MD, PhD

## 2014-05-23 NOTE — Telephone Encounter (Signed)
Discussed with patient. Patient to come get note to return to work

## 2014-05-27 ENCOUNTER — Other Ambulatory Visit (HOSPITAL_COMMUNITY): Payer: 59

## 2014-05-30 ENCOUNTER — Ambulatory Visit (HOSPITAL_COMMUNITY)
Admission: RE | Admit: 2014-05-30 | Discharge: 2014-05-30 | Disposition: A | Discharge status: Home / Self Care | Payer: BLUE CROSS/BLUE SHIELD | Source: Ambulatory Visit | Attending: Neurology | Admitting: Neurology

## 2014-05-30 DIAGNOSIS — G510 Bell's palsy: Secondary | ICD-10-CM | POA: Diagnosis present

## 2014-05-30 DIAGNOSIS — R51 Headache: Secondary | ICD-10-CM | POA: Insufficient documentation

## 2014-05-30 DIAGNOSIS — R519 Headache, unspecified: Secondary | ICD-10-CM

## 2014-05-30 MED ORDER — GADOBENATE DIMEGLUMINE 529 MG/ML IV SOLN
19.0000 mL | Freq: Once | INTRAVENOUS | Status: AC | PRN
  Administered 2014-05-30: 19 mL via INTRAVENOUS

## 2014-06-03 NOTE — Progress Notes (Signed)
Quick Note:  Please call patient: no new findings on repeat brain MRI with and without contrast. Huston FoleySaima Percilla Tweten, MD, PhD [facility]  ______

## 2014-06-24 ENCOUNTER — Telehealth: Payer: Self-pay | Admitting: Adult Health

## 2014-06-24 ENCOUNTER — Ambulatory Visit: Payer: BLUE CROSS/BLUE SHIELD | Admitting: Adult Health

## 2014-06-24 NOTE — Telephone Encounter (Signed)
I called the patient to let her know the office would be closed due to the weather.

## 2014-06-25 ENCOUNTER — Telehealth: Payer: Self-pay | Admitting: *Deleted

## 2014-06-25 NOTE — Telephone Encounter (Signed)
made an attempt to reschedule the patient but no answer

## 2014-12-30 ENCOUNTER — Encounter: Payer: Self-pay | Admitting: Adult Health

## 2014-12-30 ENCOUNTER — Ambulatory Visit (INDEPENDENT_AMBULATORY_CARE_PROVIDER_SITE_OTHER): Payer: BLUE CROSS/BLUE SHIELD | Admitting: Adult Health

## 2014-12-30 VITALS — BP 108/70 | HR 72 | Ht 65.0 in | Wt 208.0 lb

## 2014-12-30 DIAGNOSIS — B9689 Other specified bacterial agents as the cause of diseases classified elsewhere: Secondary | ICD-10-CM

## 2014-12-30 DIAGNOSIS — A499 Bacterial infection, unspecified: Secondary | ICD-10-CM | POA: Diagnosis not present

## 2014-12-30 DIAGNOSIS — N898 Other specified noninflammatory disorders of vagina: Secondary | ICD-10-CM | POA: Diagnosis not present

## 2014-12-30 DIAGNOSIS — N760 Acute vaginitis: Secondary | ICD-10-CM

## 2014-12-30 DIAGNOSIS — Z113 Encounter for screening for infections with a predominantly sexual mode of transmission: Secondary | ICD-10-CM

## 2014-12-30 HISTORY — DX: Other specified noninflammatory disorders of vagina: N89.8

## 2014-12-30 HISTORY — DX: Other specified bacterial agents as the cause of diseases classified elsewhere: B96.89

## 2014-12-30 LAB — POCT URINALYSIS DIPSTICK
GLUCOSE UA: NEGATIVE
Leukocytes, UA: NEGATIVE
Nitrite, UA: NEGATIVE
Protein, UA: NEGATIVE
RBC UA: NEGATIVE

## 2014-12-30 LAB — POCT WET PREP (WET MOUNT): WBC, Wet Prep HPF POC: POSITIVE

## 2014-12-30 MED ORDER — METRONIDAZOLE 500 MG PO TABS: 500.0000 mg | ORAL_TABLET | Freq: Two times a day (BID) | ORAL | Status: DC

## 2014-12-30 NOTE — Progress Notes (Signed)
Subjective:     Patient ID: [name], female   DOB: 08-24-1980, 34 y.o.   MRN: [id number]  HPI [name] is a 34 year old black female, married in complaining of vaginal irritation , no new partner or soaps.She still has period after ablation and it is dark at times and still has some cramps.  Review of Systems Patient denies any headaches, hearing loss, fatigue, blurred vision, shortness of breath, chest pain, abdominal pain, problems with bowel movements, urination, or intercourse. No joint pain or mood swings.See HPI for positives.  Reviewed past medical,surgical, social and family history. Reviewed medications and allergies.     Objective:   Physical Exam BP 108/70 mmHg  Pulse 72  Ht  (1.651 m)  Wt 208 lb (94.348 kg)  BMI 34.61 kg/m2  LMP 12/03/2014  Urine dipstick negative,Skin warm and dry.Pelvic: external genitalia is normal in appearance no lesions, vagina: pinkish tandischarge with slight odor,urethra has no lesions or masses noted, cervix:smooth and bulbous, uterus: normal size, shape and contour, non tender, no masses felt, adnexa: no masses or tenderness noted. Bladder is non tender and no masses felt. Wet prep: + for clue cells and +WBCs. GC/CHL obtained.     Assessment:     Vaginal irritation BV STD screening    Plan:     Rx flagyl 500 mg 1 bid x 7 days, no alcohol, review handout on BV, no sex during treatment   Check HIV,RPR,HSV2 and GC/CHL   Follow up prn

## 2014-12-30 NOTE — Patient Instructions (Signed)
Bacterial Vaginosis Bacterial vaginosis is a vaginal infection that occurs when the normal balance of bacteria in the vagina is disrupted. It results from an overgrowth of certain bacteria. This is the most common vaginal infection in women of childbearing age. Treatment is important to prevent complications, especially in pregnant women, as it can cause a premature delivery. CAUSES  Bacterial vaginosis is caused by an increase in harmful bacteria that are normally present in smaller amounts in the vagina. Several different kinds of bacteria can cause bacterial vaginosis. However, the reason that the condition develops is not fully understood. RISK FACTORS Certain activities or behaviors can put you at an increased risk of developing bacterial vaginosis, including:  Having a new sex partner or multiple sex partners.  Douching.  Using an intrauterine device (IUD) for contraception. Women do not get bacterial vaginosis from toilet seats, bedding, swimming pools, or contact with objects around them. SIGNS AND SYMPTOMS  Some women with bacterial vaginosis have no signs or symptoms. Common symptoms include:  Grey vaginal discharge.  A fishlike odor with discharge, especially after sexual intercourse.  Itching or burning of the vagina and vulva.  Burning or pain with urination. DIAGNOSIS  Your health care provider will take a medical history and examine the vagina for signs of bacterial vaginosis. A sample of vaginal fluid may be taken. Your health care provider will look at this sample under a microscope to check for bacteria and abnormal cells. A vaginal pH test may also be done.  TREATMENT  Bacterial vaginosis may be treated with antibiotic medicines. These may be given in the form of a pill or a vaginal cream. A second round of antibiotics may be prescribed if the condition comes back after treatment.  HOME CARE INSTRUCTIONS   Only take over-the-counter or prescription medicines as  directed by your health care provider.  If antibiotic medicine was prescribed, take it as directed. Make sure you finish it even if you start to feel better.  Do not have sex until treatment is completed.  Tell all sexual partners that you have a vaginal infection. They should see their health care provider and be treated if they have problems, such as a mild rash or itching.  Practice safe sex by using condoms and only having one sex partner. SEEK MEDICAL CARE IF:   Your symptoms are not improving after 3 days of treatment.  You have increased discharge or pain.  You have a fever. MAKE SURE YOU:   Understand these instructions.  Will watch your condition.  Will get help right away if you are not doing well or get worse. FOR MORE INFORMATION  Centers for Disease Control and Prevention, Division of STD Prevention: www.cdc.gov/std American Sexual Health Association (ASHA): www.ashastd.org  Document Released: 04/26/2005 Document Revised: 02/14/2013 Document Reviewed: 12/06/2012 ExitCare Patient Information 2015 [employer]. This information is not intended to replace advice given to you by your health care provider. Make sure you discuss any questions you have with your health care provider. No alcohol no sex during treatment Follow up prn 

## 2014-12-31 ENCOUNTER — Telehealth: Payer: Self-pay | Admitting: Adult Health

## 2014-12-31 ENCOUNTER — Encounter: Payer: Self-pay | Admitting: Adult Health

## 2014-12-31 DIAGNOSIS — R894 Abnormal immunological findings in specimens from other organs, systems and tissues: Secondary | ICD-10-CM

## 2014-12-31 HISTORY — DX: Abnormal immunological findings in specimens from other organs, systems and tissues: R89.4

## 2014-12-31 LAB — GC/CHLAMYDIA PROBE AMP
Chlamydia trachomatis, NAA: NEGATIVE
Neisseria gonorrhoeae by PCR: NEGATIVE

## 2014-12-31 LAB — RPR: RPR Ser Ql: NONREACTIVE

## 2014-12-31 LAB — HIV ANTIBODY (ROUTINE TESTING W REFLEX): HIV SCREEN 4TH GENERATION: NONREACTIVE

## 2014-12-31 LAB — HSV 2 ANTIBODY, IGG: HSV 2 GLYCOPROTEIN G AB, IGG: 5.99 {index} — AB (ref 0.00–0.90)

## 2014-12-31 NOTE — Telephone Encounter (Signed)
Pt aware of labs and +HSV 

## 2015-02-05 ENCOUNTER — Encounter: Payer: Self-pay | Admitting: Adult Health

## 2015-02-05 ENCOUNTER — Ambulatory Visit (INDEPENDENT_AMBULATORY_CARE_PROVIDER_SITE_OTHER): Payer: BLUE CROSS/BLUE SHIELD | Admitting: Adult Health

## 2015-02-05 VITALS — BP 120/80 | HR 72 | Ht 65.0 in | Wt 208.5 lb

## 2015-02-05 DIAGNOSIS — Z01419 Encounter for gynecological examination (general) (routine) without abnormal findings: Secondary | ICD-10-CM | POA: Diagnosis not present

## 2015-02-05 DIAGNOSIS — N946 Dysmenorrhea, unspecified: Secondary | ICD-10-CM

## 2015-02-05 DIAGNOSIS — N9089 Other specified noninflammatory disorders of vulva and perineum: Secondary | ICD-10-CM

## 2015-02-05 HISTORY — DX: Dysmenorrhea, unspecified: N94.6

## 2015-02-05 HISTORY — DX: Other specified noninflammatory disorders of vulva and perineum: N90.89

## 2015-02-05 MED ORDER — NYSTATIN-TRIAMCINOLONE 100000-0.1 UNIT/GM-% EX CREA: 1.0000 "application " | TOPICAL_CREAM | Freq: Two times a day (BID) | CUTANEOUS | Status: DC

## 2015-02-05 MED ORDER — NAPROXEN SODIUM 550 MG PO TABS: 550.0000 mg | ORAL_TABLET | Freq: Two times a day (BID) | ORAL | Status: DC

## 2015-02-05 NOTE — Progress Notes (Signed)
Patient ID: [name], female   DOB: 08/05/1980, 34 y.o.   MRN: [id number] History of Present Illness:  [name] is a 34 year old black female in for a well woman gyn exam, she had a normal pap with negative HPV 06/28/12.  Current Medications, Allergies, Past Medical History, Past Surgical History, Family History and Social History were reviewed in [name] record.     Review of Systems: Patient denies any headaches, hearing loss, fatigue, blurred vision, shortness of breath, chest pain, abdominal pain, problems with bowel movements, urination, or intercourse. No joint pain or mood swings.She complains of labial  irritation and period cramps and still bleeds even though had ablation.    Physical Exam:BP 120/80 mmHg  Pulse 72  Ht  (1.651 m)  Wt 208 lb 8 oz (94.575 kg)  BMI 34.70 kg/m2  LMP 01/29/2015 General:  Well developed, well nourished, no acute distress Skin:  Warm and dry Neck:  Midline trachea, normal thyroid, good ROM, no lymphadenopathy Lungs; Clear to auscultation bilaterally Breast:  No dominant palpable mass, retraction, or nipple discharge Cardiovascular: Regular rate and rhythm Abdomen:  Soft, non tender, no hepatosplenomegaly Pelvic:  External genitalia is normal in appearance, no lesions.  The vagina is normal in appearance. Urethra has no lesions or masses. The cervix is bulbous.  Uterus is felt to be normal size, shape, and contour.  No adnexal masses or tenderness noted.Bladder is non tender, no masses felt. Extremities/musculoskeletal:  No swelling or varicosities noted, no clubbing or cyanosis Psych:  No mood changes, alert and cooperative,seems happy   Impression: Well woman gyn exam no pap Labial irritation Menstrual cramps    Plan:  Rx mytrex cream use 2-3 x daily prn with 1 refill Rx anaprox ds #60 1 bid prn cramps with 1 refill Pap and physical in 1 year Mammogram at 40

## 2015-02-05 NOTE — Patient Instructions (Signed)
Pap and physical in 1 year Mammogram at 40   

## 2015-03-17 ENCOUNTER — Encounter (HOSPITAL_COMMUNITY): Payer: Self-pay | Admitting: Cardiology

## 2015-03-17 ENCOUNTER — Emergency Department (HOSPITAL_COMMUNITY): Payer: BLUE CROSS/BLUE SHIELD

## 2015-03-17 ENCOUNTER — Emergency Department (HOSPITAL_COMMUNITY)
Admission: EM | Admit: 2015-03-17 | Discharge: 2015-03-17 | Disposition: A | Discharge status: Home / Self Care | Payer: BLUE CROSS/BLUE SHIELD | Attending: Emergency Medicine | Admitting: Emergency Medicine

## 2015-03-17 DIAGNOSIS — R011 Cardiac murmur, unspecified: Secondary | ICD-10-CM | POA: Insufficient documentation

## 2015-03-17 DIAGNOSIS — Z8742 Personal history of other diseases of the female genital tract: Secondary | ICD-10-CM | POA: Diagnosis not present

## 2015-03-17 DIAGNOSIS — R079 Chest pain, unspecified: Secondary | ICD-10-CM | POA: Insufficient documentation

## 2015-03-17 DIAGNOSIS — Z791 Long term (current) use of non-steroidal anti-inflammatories (NSAID): Secondary | ICD-10-CM | POA: Diagnosis not present

## 2015-03-17 DIAGNOSIS — Z8619 Personal history of other infectious and parasitic diseases: Secondary | ICD-10-CM | POA: Insufficient documentation

## 2015-03-17 DIAGNOSIS — Z87891 Personal history of nicotine dependence: Secondary | ICD-10-CM | POA: Diagnosis not present

## 2015-03-17 MED ORDER — NAPROXEN 500 MG PO TABS: 500.0000 mg | ORAL_TABLET | Freq: Two times a day (BID) | ORAL | Status: DC

## 2015-03-17 NOTE — ED Notes (Signed)
crampy pain to right side of chest that started yesterday.  Pain worse with movement and deep breaths.

## 2015-03-17 NOTE — ED Notes (Signed)
Pt made aware to return if symptoms worsen or if any life threatening symptoms occur.   

## 2015-03-17 NOTE — ED Provider Notes (Signed)
CSN: [id number]     Arrival date & time 03/17/15  1004 History   First MD Initiated Contact with Patient 03/17/15 1346     Chief Complaint  Patient presents with  . Chest Pain     (Consider location/radiation/quality/duration/timing/severity/associated sxs/prior Treatment) HPI Comments: The patient is a 34 year old female, she has no significant past medical history, she denies using oral contraceptive pills, has no other risk factors for pulmonary embolism or acute coronary syndrome. She states that she developed chest pain yesterday evening, this came on at rest, is a crampy feeling in the right side of her chest that is worse when she went over, worse when she takes a deep breath and worse when she twists from side to side. She works out at [employer] every day after work for an hour on cardio machines, she has no difficulty with that. She has never had exertional symptoms. Her symptoms seem to be constant and worse with palpation of the chest as well.  Patient is a 34 y.o. female presenting with chest pain. The history is provided by the patient.  Chest Pain   Past Medical History  Diagnosis Date  . Heart murmur   . Menorrhagia 03/06/2013  . Vaginal irritation 12/30/2014  . BV (bacterial vaginosis) 12/30/2014  . Positive test for herpes simplex virus (HSV) antibody 12/31/2014  . Herpes simplex virus (HSV) infection   . Labial irritation 02/05/2015  . Menstrual cramps 02/05/2015   Past Surgical History  Procedure Laterality Date  . Cesarean section      x2,2006,2011  . Laparoscopic incisional / umbilical / ventral hernia repair  09/2010    Dr. Lovell SheehanJenkins  . Tubal ligation  2011  . Dilitation & currettage/hystroscopy with thermachoice ablation N/A 04/11/2013    Procedure: DILATATION & CURETTAGE/HYSTEROSCOPY WITH THERMACHOICE ABLATION;  Surgeon: Lazaro ArmsLuther H Eure, MD;  Location: [facility];  Service: Gynecology;  Laterality: N/A;  Total Ablation Therapy Time = 9 minutes 38 seconds; 86-88 deg C  .  Hernia repair  2012   Family History  Problem Relation Age of Onset  . Cancer Paternal Uncle     unknown  . Cancer Paternal Grandmother     unknown  . Diabetes Maternal Grandfather   . Alcohol abuse Paternal Grandfather    Social History  Substance Use Topics  . Smoking status: Former Smoker -- 0.25 packs/day for 5 years    Types: Cigarettes    Quit date: 04/03/2008  . Smokeless tobacco: Never Used  . Alcohol Use: 0.0 oz/week    0 Standard drinks or equivalent per week     Comment: occ   OB History    Gravida Para Term Preterm AB TAB SAB Ectopic Multiple Living   2 2        2      Review of Systems  Cardiovascular: Positive for chest pain.  All other systems reviewed and are negative.     Allergies  Review of patient's allergies indicates no known allergies.  Home Medications   Prior to Admission medications   Medication Sig Start Date End Date Taking? Authorizing Provider  naproxen sodium (ANAPROX) 550 MG tablet Take 1 tablet (550 mg total) by mouth 2 (two) times daily with a meal. 02/05/15  Yes Adline PotterJennifer A Griffin, NP  nystatin-triamcinolone (MYCOLOG II) cream Apply 1 application topically 2 (two) times daily. 02/05/15  Yes Adline PotterJennifer A Griffin, NP  naproxen (NAPROSYN) 500 MG tablet Take 1 tablet (500 mg total) by mouth 2 (two) times daily with  a meal. 03/17/15   Eber Hong, MD   BP 147/82 mmHg  Pulse 64  Temp(Src) 98.1 F (36.7 C) (Oral)  Resp 18  Ht  (1.651 m)  Wt 208 lb (94.348 kg)  BMI 34.61 kg/m2  SpO2 100%  LMP 02/28/2015 Physical Exam  Constitutional: She appears well-developed and well-nourished. No distress.  HENT:  Head: Normocephalic and atraumatic.  Mouth/Throat: Oropharynx is clear and moist. No oropharyngeal exudate.  Eyes: Conjunctivae and EOM are normal. Pupils are equal, round, and reactive to light. Right eye exhibits no discharge. Left eye exhibits no discharge. No scleral icterus.  Neck: Normal range of motion. Neck supple. No JVD  present. No thyromegaly present.  Cardiovascular: Normal rate, regular rhythm, normal heart sounds and intact distal pulses.  Exam reveals no gallop and no friction rub.   No murmur heard. Pulmonary/Chest: Effort normal and breath sounds normal. No respiratory distress. She has no wheezes. She has no rales. She exhibits tenderness ( Reproducible palpable tenderness to the right chest wall, worse with range of motion of the arm and position).  Abdominal: Soft. Bowel sounds are normal. She exhibits no distension and no mass. There is no tenderness.  Musculoskeletal: Normal range of motion. She exhibits no edema or tenderness.  Lymphadenopathy:    She has no cervical adenopathy.  Neurological: She is alert. Coordination normal.  Skin: Skin is warm and dry. No rash noted. No erythema.  Psychiatric: She has a normal mood and affect. Her behavior is normal.  Nursing note and vitals reviewed.   ED Course  Procedures (including critical care time) Labs Review Labs Reviewed - No data to display  Imaging Review Dg Chest 2 View  03/17/2015  CLINICAL DATA:  Right side anterior chest pain since yesterday. Former smoker for 5 years. EXAM: CHEST  2 VIEW COMPARISON:  None. FINDINGS: The heart size and mediastinal contours are within normal limits. Both lungs are clear. The visualized skeletal structures are unremarkable. IMPRESSION: No active cardiopulmonary disease. Electronically Signed   By: Charlett Nose M.D.   On: 03/17/2015 14:33   I have personally reviewed and evaluated these images and lab results as part of my medical decision-making.   EKG Interpretation   Date/Time:  Monday March 17 2015 10:16:46 EST Ventricular Rate:  68 PR Interval:  136 QRS Duration: 96 QT Interval:  438 QTC Calculation: 465 R Axis:   50 Text Interpretation:  Left axis deviation Borderline Prolonged QT Normal  sinus rhythm Nonspecific T wave abnormality ECG OTHERWISE WITHIN NORMAL  LIMITS since last tracing no  significant change Confirmed by Hyacinth Meeker  MD,  Karolyne Timmons ([id number]) on 03/17/2015 1:52:01 PM      MDM   Final diagnoses:  Chest pain, unspecified chest pain type    No signs of pulmonary embolism, low risk, per negative, no acute coronary syndrome risk factors, EKG is unremarkable, chest x-ray pending, likely musculoskeletal pain, rule out pneumonia or pneumothorax with x-ray.  Declines pain medicines  I have personally viewed and interpreted the imaging and agree with radiologist interpretation.  Xray neg,  Stable for d/c.  Meds given in ED:  Medications - No data to display  New Prescriptions   NAPROXEN (NAPROSYN) 500 MG TABLET    Take 1 tablet (500 mg total) by mouth 2 (two) times daily with a meal.      Eber Hong, MD 03/17/15 1448

## 2015-03-17 NOTE — Discharge Instructions (Signed)

## 2015-03-19 ENCOUNTER — Encounter: Payer: Self-pay | Admitting: Family Medicine

## 2015-03-19 ENCOUNTER — Ambulatory Visit (INDEPENDENT_AMBULATORY_CARE_PROVIDER_SITE_OTHER): Payer: BLUE CROSS/BLUE SHIELD | Admitting: Family Medicine

## 2015-03-19 VITALS — BP 122/84 | Temp 99.2°F | Ht 66.0 in | Wt 205.4 lb

## 2015-03-19 DIAGNOSIS — J020 Streptococcal pharyngitis: Secondary | ICD-10-CM | POA: Diagnosis not present

## 2015-03-19 DIAGNOSIS — J029 Acute pharyngitis, unspecified: Secondary | ICD-10-CM

## 2015-03-19 LAB — POCT RAPID STREP A (OFFICE): Rapid Strep A Screen: POSITIVE — AB

## 2015-03-19 MED ORDER — AZITHROMYCIN 250 MG PO TABS
ORAL_TABLET | ORAL | Status: DC
Start: 2015-03-19 — End: 2015-07-02

## 2015-03-19 NOTE — Progress Notes (Signed)
   Subjective:    Patient ID: [name], female    DOB: 07/09/1980, [age] y.o.   MRN: [id number]  Sore Throat  This is a new problem. The current episode started in the past 7 days. The problem has been gradually worsening. Neither side of throat is experiencing more pain than the other. Associated symptoms include headaches. Associated symptoms comments: Wheezing. Treatments tried: Tour managerAlkaSetzer.   Patient states no other concerns this visit. Started feeling bad Monday  Chest wasaching , then throat and head starting huritng  tmax felt hot   Results for orders placed or performed in visit on 03/19/15  POCT rapid strep A  Result Value Ref Range   Rapid Strep A Screen Positive (A) Negative   * Review of Systems  Neurological: Positive for headaches.    positive fever no vomiting no rash ROS otherwise negative    Objective:   Physical Exam   alert vitals stable positive exudative tonsillitis tender anterior nodes neck supple lungs clear heart regular in      Assessment & Plan:   impression strep throat with cervical lymphadenitis plan antibiotics prescribed. Symptom care discussed warning signs discussed WSL

## 2015-07-02 ENCOUNTER — Other Ambulatory Visit: Payer: Self-pay | Admitting: Nurse Practitioner

## 2015-07-02 ENCOUNTER — Telehealth: Payer: Self-pay | Admitting: Nurse Practitioner

## 2015-07-02 ENCOUNTER — Ambulatory Visit (INDEPENDENT_AMBULATORY_CARE_PROVIDER_SITE_OTHER): Payer: BLUE CROSS/BLUE SHIELD | Admitting: Nurse Practitioner

## 2015-07-02 ENCOUNTER — Encounter: Payer: Self-pay | Admitting: Nurse Practitioner

## 2015-07-02 VITALS — BP 130/80 | Ht 66.0 in | Wt 201.2 lb

## 2015-07-02 DIAGNOSIS — Z139 Encounter for screening, unspecified: Secondary | ICD-10-CM | POA: Diagnosis not present

## 2015-07-02 DIAGNOSIS — L97919 Non-pressure chronic ulcer of unspecified part of right lower leg with unspecified severity: Secondary | ICD-10-CM

## 2015-07-02 DIAGNOSIS — K458 Other specified abdominal hernia without obstruction or gangrene: Secondary | ICD-10-CM | POA: Diagnosis not present

## 2015-07-02 DIAGNOSIS — I83019 Varicose veins of right lower extremity with ulcer of unspecified site: Secondary | ICD-10-CM

## 2015-07-02 MED ORDER — IBUPROFEN 800 MG PO TABS
800.0000 mg | ORAL_TABLET | Freq: Three times a day (TID) | ORAL | Status: DC | PRN
Start: 2015-07-02 — End: 2016-02-10

## 2015-07-02 NOTE — Telephone Encounter (Signed)
Patient would like a script for Ibuprofen  for headaches.

## 2015-07-03 ENCOUNTER — Encounter: Payer: Self-pay | Admitting: Nurse Practitioner

## 2015-07-03 DIAGNOSIS — K458 Other specified abdominal hernia without obstruction or gangrene: Secondary | ICD-10-CM | POA: Insufficient documentation

## 2015-07-03 DIAGNOSIS — L97909 Non-pressure chronic ulcer of unspecified part of unspecified lower leg with unspecified severity: Secondary | ICD-10-CM | POA: Insufficient documentation

## 2015-07-03 DIAGNOSIS — I83009 Varicose veins of unspecified lower extremity with ulcer of unspecified site: Secondary | ICD-10-CM | POA: Insufficient documentation

## 2015-07-03 NOTE — Progress Notes (Signed)
Subjective:   Presents for complaints of a possible hernia in the abdominal area near the area where she had hernia repair and mesh in 2012. Has felt bulging and tenderness for the past 2 years, now painful every day. Has started doing herbal life weight loss program working with a nutritionist since January. Migraines have eased up some. Working third shift averaging 5-6 hours of sleep per night, this is limited because she has to pick up her children from school in the afternoon. Sleep is typically broken up. Also has occasional swelling in the lower legs. Having more stinging and discomfort with her varicose veins. Is due for routine lab work.  Objective:   BP 130/80 mmHg  Ht  (1.676 m)  Wt 201 lb 4 oz (91.286 kg)  BMI 32.50 kg/m2  NAD. Alert, oriented. Lungs clear. Heart regular rate rhythm. Abdomen mildly obese soft nondistended with a bulging above the umbilical area worse with sitting. Mild varicose veins noted in the lower extremities, trace edema.  Assessment:  Problem List Items Addressed This Visit      Cardiovascular and Mediastinum   Varicose veins of lower extremities with ulcer (HCC)     Other   Other specified abdominal hernia without obstruction or gangrene - Primary   Relevant Orders   US Abdomen Complete    Other Visit Diagnoses    Screening        Relevant Orders    CBC with Differential/Platelet    Lipid panel    Hepatic function panel    Basic metabolic panel    TSH    VITAMIN D 25 Hydroxy (Vit-D Deficiency, Fractures)      Plan:  Lab work pending. Encouraged continued weight loss. Discussed sleep issues at length. Will refer to surgeon for evaluation. Warning signs discussed regarding hernia, seek help immediately if any problems. Given information for vein clinics in [place], patient to schedule her own appointment.

## 2015-07-04 ENCOUNTER — Other Ambulatory Visit: Payer: Self-pay | Admitting: Nurse Practitioner

## 2015-07-04 DIAGNOSIS — E559 Vitamin D deficiency, unspecified: Secondary | ICD-10-CM

## 2015-07-04 LAB — BASIC METABOLIC PANEL
BUN/Creatinine Ratio: 19 (ref 8–20)
BUN: 15 mg/dL (ref 6–20)
CO2: 25 mmol/L (ref 18–29)
CREATININE: 0.79 mg/dL (ref 0.57–1.00)
Calcium: 9.3 mg/dL (ref 8.7–10.2)
Chloride: 102 mmol/L (ref 96–106)
GFR calc Af Amer: 113 mL/min/{1.73_m2} (ref >59)
GFR, EST NON AFRICAN AMERICAN: 98 mL/min/{1.73_m2} (ref >59)
Glucose: 91 mg/dL (ref 65–99)
Potassium: 3.9 mmol/L (ref 3.5–5.2)
SODIUM: 140 mmol/L (ref 134–144)

## 2015-07-04 LAB — CBC WITH DIFFERENTIAL/PLATELET
BASOS ABS: 0 10*3/uL (ref 0.0–0.2)
Basos: 0 %
EOS (ABSOLUTE): 0 10*3/uL (ref 0.0–0.4)
EOS: 1 %
HEMOGLOBIN: 13.1 g/dL (ref 11.1–15.9)
Hematocrit: 39.5 % (ref 34.0–46.6)
Immature Grans (Abs): 0 10*3/uL (ref 0.0–0.1)
Immature Granulocytes: 0 %
LYMPHS ABS: 1.9 10*3/uL (ref 0.7–3.1)
Lymphs: 43 %
MCH: 29.5 pg (ref 26.6–33.0)
MCHC: 33.2 g/dL (ref 31.5–35.7)
MCV: 89 fL (ref 79–97)
Monocytes Absolute: 0.3 10*3/uL (ref 0.1–0.9)
Monocytes: 7 %
Neutrophils Absolute: 2.2 10*3/uL (ref 1.4–7.0)
Neutrophils: 49 %
Platelets: 321 10*3/uL (ref 150–379)
RBC: 4.44 x10E6/uL (ref 3.77–5.28)
RDW: 13.8 % (ref 12.3–15.4)
WBC: 4.5 10*3/uL (ref 3.4–10.8)

## 2015-07-04 LAB — HEPATIC FUNCTION PANEL
ALBUMIN: 4.1 g/dL (ref 3.5–5.5)
ALK PHOS: 69 IU/L (ref 39–117)
ALT: 25 IU/L (ref 0–32)
AST: 24 IU/L (ref 0–40)
BILIRUBIN TOTAL: 0.3 mg/dL (ref 0.0–1.2)
Bilirubin, Direct: 0.09 mg/dL (ref 0.00–0.40)
TOTAL PROTEIN: 6.8 g/dL (ref 6.0–8.5)

## 2015-07-04 LAB — LIPID PANEL
CHOL/HDL RATIO: 2.6 ratio (ref 0.0–4.4)
Cholesterol, Total: 140 mg/dL (ref 100–199)
HDL: 54 mg/dL (ref >39)
LDL CALC: 79 mg/dL (ref 0–99)
Triglycerides: 33 mg/dL (ref 0–149)
VLDL Cholesterol Cal: 7 mg/dL (ref 5–40)

## 2015-07-04 LAB — VITAMIN D 25 HYDROXY (VIT D DEFICIENCY, FRACTURES): VIT D 25 HYDROXY: 18.7 ng/mL — AB (ref 30.0–100.0)

## 2015-07-04 LAB — TSH: TSH: 0.572 u[IU]/mL (ref 0.450–4.500)

## 2015-07-04 MED ORDER — VITAMIN D (ERGOCALCIFEROL) 1.25 MG (50000 UNIT) PO CAPS: 50000.0000 [IU] | ORAL_CAPSULE | ORAL | Status: DC

## 2015-07-05 ENCOUNTER — Encounter (HOSPITAL_COMMUNITY): Payer: Self-pay | Admitting: *Deleted

## 2015-07-05 ENCOUNTER — Emergency Department (HOSPITAL_COMMUNITY)
Admission: EM | Admit: 2015-07-05 | Discharge: 2015-07-05 | Disposition: A | Discharge status: Home / Self Care | Payer: BLUE CROSS/BLUE SHIELD | Attending: Emergency Medicine | Admitting: Emergency Medicine

## 2015-07-05 ENCOUNTER — Emergency Department (HOSPITAL_COMMUNITY): Payer: BLUE CROSS/BLUE SHIELD

## 2015-07-05 DIAGNOSIS — Z8742 Personal history of other diseases of the female genital tract: Secondary | ICD-10-CM | POA: Diagnosis not present

## 2015-07-05 DIAGNOSIS — R52 Pain, unspecified: Secondary | ICD-10-CM | POA: Diagnosis present

## 2015-07-05 DIAGNOSIS — R69 Illness, unspecified: Secondary | ICD-10-CM

## 2015-07-05 DIAGNOSIS — J111 Influenza due to unidentified influenza virus with other respiratory manifestations: Secondary | ICD-10-CM | POA: Insufficient documentation

## 2015-07-05 DIAGNOSIS — Z87891 Personal history of nicotine dependence: Secondary | ICD-10-CM | POA: Diagnosis not present

## 2015-07-05 DIAGNOSIS — Z8619 Personal history of other infectious and parasitic diseases: Secondary | ICD-10-CM | POA: Diagnosis not present

## 2015-07-05 LAB — PREGNANCY, URINE: PREG TEST UR: NEGATIVE

## 2015-07-05 LAB — URINALYSIS, ROUTINE W REFLEX MICROSCOPIC
BILIRUBIN URINE: NEGATIVE
Glucose, UA: NEGATIVE mg/dL
Hgb urine dipstick: NEGATIVE
KETONES UR: NEGATIVE mg/dL
Leukocytes, UA: NEGATIVE
NITRITE: NEGATIVE
Protein, ur: NEGATIVE mg/dL
Specific Gravity, Urine: 1.015 (ref 1.005–1.030)
pH: 7 (ref 5.0–8.0)

## 2015-07-05 NOTE — Discharge Instructions (Signed)

## 2015-07-05 NOTE — ED Notes (Signed)
Pt reports body aches, chill, cough since yesterday. Denies fevers, n/v/d.

## 2015-07-05 NOTE — ED Provider Notes (Signed)
CSN: [id number]     Arrival date & time 07/05/15  4098 History   First MD Initiated Contact with Patient 07/05/15 [phone]     Chief Complaint  Patient presents with  . Generalized Body Aches     (Consider location/radiation/quality/duration/timing/severity/associated sxs/prior Treatment) HPI Comments: Patient with 2 days of body aches, chills and cough that onset yesterday afternoon. Denies fever. No nausea vomiting or diarrhea. Endorses runny nose, sore throat, cough that is not productive. Some chest tightness with coughing. No abdominal pain. Did not check her temperature at home. She did not receive a flu shot. No sick contacts that her husband had an illness last week. Good by mouth intake and urine output. States she is has been urinating more frequently and it hurts to go.  The history is provided by the patient.    Past Medical History  Diagnosis Date  . Heart murmur   . Menorrhagia 03/06/2013  . Vaginal irritation 12/30/2014  . BV (bacterial vaginosis) 12/30/2014  . Positive test for herpes simplex virus (HSV) antibody 12/31/2014  . Herpes simplex virus (HSV) infection   . Labial irritation 02/05/2015  . Menstrual cramps 02/05/2015   Past Surgical History  Procedure Laterality Date  . Cesarean section      x2,2006,2011  . Laparoscopic incisional / umbilical / ventral hernia repair  09/2010    Dr. Lovell Sheehan  . Tubal ligation  2011  . Dilitation & currettage/hystroscopy with thermachoice ablation N/A 04/11/2013    Procedure: DILATATION & CURETTAGE/HYSTEROSCOPY WITH THERMACHOICE ABLATION;  Surgeon: Lazaro Arms, MD;  Location: [facility];  Service: Gynecology;  Laterality: N/A;  Total Ablation Therapy Time = 9 minutes 38 seconds; 86-88 deg C  . Hernia repair  2012   Family History  Problem Relation Age of Onset  . Cancer Paternal Uncle     unknown  . Cancer Paternal Grandmother     unknown  . Diabetes Maternal Grandfather   . Alcohol abuse Paternal Grandfather    Social History   Substance Use Topics  . Smoking status: Former Smoker -- 0.25 packs/day for 5 years    Types: Cigarettes    Quit date: 04/03/2008  . Smokeless tobacco: Never Used  . Alcohol Use: 0.0 oz/week    0 Standard drinks or equivalent per week     Comment: occ   OB History    Gravida Para Term Preterm AB TAB SAB Ectopic Multiple Living   Review of Systems  Constitutional: Positive for chills, activity change, appetite change and fatigue. Negative for fever.  HENT: Positive for congestion and rhinorrhea.   Eyes: Negative for visual disturbance.  Respiratory: Positive for chest tightness. Negative for cough and shortness of breath.   Cardiovascular: Negative for chest pain.  Gastrointestinal: Negative for nausea, vomiting and abdominal pain.  Genitourinary: Negative for dysuria, hematuria, vaginal bleeding and vaginal discharge.  Musculoskeletal: Positive for myalgias and arthralgias.  Skin: Negative for rash.  Neurological: Negative for dizziness, weakness and headaches.  A complete 10 system review of systems was obtained and all systems are negative except as noted in the HPI and PMH.      Allergies  Review of patient's allergies indicates no known allergies.  Home Medications   Prior to Admission medications   Medication Sig Start Date End Date Taking? Authorizing Provider  ibuprofen (ADVIL,MOTRIN) 800 MG tablet Take 1 tablet (800 mg total) by mouth every 8 (eight) hours as  needed. 07/02/15   Campbell Riches, NP  Vitamin D, Ergocalciferol, (DRISDOL) 50000 units CAPS capsule Take 1 capsule (50,000 Units total) by mouth every 7 (seven) days. 07/04/15   Campbell Riches, NP   BP 120/86 mmHg  Pulse 94  Temp(Src) 99.1 F (37.3 C) (Oral)  Resp 18  Ht  (1.651 m)  Wt 199 lb (90.266 kg)  BMI 33.12 kg/m2  SpO2 100%  LMP 06/26/2015 Physical Exam  Constitutional: She is oriented to person, place, and time. She appears well-developed and well-nourished. No  distress.  HENT:  Head: Normocephalic and atraumatic.  Mouth/Throat: Oropharynx is clear and moist. No oropharyngeal exudate.  Eyes: Conjunctivae and EOM are normal. Pupils are equal, round, and reactive to light.  Neck: Normal range of motion. Neck supple.  No meningismus.  Cardiovascular: Normal rate, regular rhythm, normal heart sounds and intact distal pulses.   No murmur heard. Pulmonary/Chest: Effort normal and breath sounds normal. No respiratory distress. She exhibits no tenderness.  Abdominal: Soft. There is no tenderness. There is no rebound and no guarding.  Musculoskeletal: Normal range of motion. She exhibits no edema or tenderness.  Neurological: She is alert and oriented to person, place, and time. No cranial nerve deficit. She exhibits normal muscle tone. Coordination normal.  No ataxia on finger to nose bilaterally. No pronator drift. 5/5 strength throughout. CN 2-12 intact.Equal grip strength. Sensation intact.   Skin: Skin is warm.  Psychiatric: She has a normal mood and affect. Her behavior is normal.  Nursing note and vitals reviewed.   ED Course  Procedures (including critical care time) Labs Review Labs Reviewed  URINALYSIS, ROUTINE W REFLEX MICROSCOPIC (NOT AT [facility])  PREGNANCY, URINE    Imaging Review Dg Chest 2 View  07/05/2015  CLINICAL DATA:  35 year old female with a history of cough and fever for 1 day EXAM: CHEST - 2 VIEW COMPARISON:  03/17/2015 FINDINGS: Cardiomediastinal silhouette projects within normal limits in size and contour. No confluent airspace disease, pneumothorax, or pleural effusion. No displaced fracture. Unremarkable appearance of the upper abdomen. IMPRESSION: No radiographic evidence of acute cardiopulmonary disease. Signed, Yvone Neu. Loreta Ave, DO [facility] Radiology Electronically Signed   By: Gilmer Mor D.O.   On: 07/05/2015 09:06   I have personally reviewed and evaluated these images  and lab results as part of my medical decision-making.   EKG Interpretation None      MDM   Final diagnoses:  Influenza-like illness   2 days of body aches, cough, congestion.  No fever, vomiting, diarrhea.  Chest x-ray negative. Urinalysis is negative. Patient is well-appearing. She is tolerating by mouth. Suspect viral syndrome possibly influenza. Risks and benefits of Tamiflu discussed with patient which she declines.  Follow-up with PCP. Return precautions discussed. Continue PO hydration and antipyretics. Return precautions discussed.    Glynn Octave, MD 07/05/15 [phone]

## 2015-07-07 ENCOUNTER — Ambulatory Visit (INDEPENDENT_AMBULATORY_CARE_PROVIDER_SITE_OTHER): Payer: BLUE CROSS/BLUE SHIELD | Admitting: Nurse Practitioner

## 2015-07-07 ENCOUNTER — Encounter: Payer: Self-pay | Admitting: Family Medicine

## 2015-07-07 ENCOUNTER — Encounter: Payer: Self-pay | Admitting: Nurse Practitioner

## 2015-07-07 VITALS — Temp 98.3°F | Ht 66.0 in | Wt 201.0 lb

## 2015-07-07 DIAGNOSIS — J069 Acute upper respiratory infection, unspecified: Secondary | ICD-10-CM

## 2015-07-07 DIAGNOSIS — J209 Acute bronchitis, unspecified: Secondary | ICD-10-CM | POA: Diagnosis not present

## 2015-07-07 DIAGNOSIS — B9689 Other specified bacterial agents as the cause of diseases classified elsewhere: Secondary | ICD-10-CM

## 2015-07-07 MED ORDER — HYDROCODONE-HOMATROPINE 5-1.5 MG/5ML PO SYRP: 5.0000 mL | ORAL_SOLUTION | ORAL | Status: DC | PRN

## 2015-07-07 MED ORDER — AZITHROMYCIN 250 MG PO TABS: ORAL_TABLET | ORAL | Status: DC

## 2015-07-07 NOTE — Progress Notes (Signed)
Subjective:  Presents for complaints of cough and congestion that began 3 days ago. Began running a fever yesterday. Frequent cough. Headache. Achiness. Tired. Slight chest pain with cough. Head congestion. Runny nose. Mild wheezing at times. Sore throat. Ear pain. No nausea or vomiting. Taking fluids well. Voiding normal limit.  Objective:   Temp(Src) 98.3 F (36.8 C) (Oral)  Ht  (1.676 m)  Wt 201 lb (91.173 kg)  BMI 32.46 kg/m2  LMP 06/26/2015 NAD. Alert, oriented. TMs clear effusion, no erythema. Pharynx injected with green PND noted. Neck supple with mild soft anterior adenopathy. Occasional nonproductive cough noted. Lungs scattered coarse expiratory crackles mainly noted on the right side, otherwise clear. No tachypnea. Heart regular rate rhythm.  Assessment: Bacterial upper respiratory infection  Acute bronchitis, unspecified organism  most likely resolving influenza Plan:  Meds ordered this encounter  Medications  . azithromycin (ZITHROMAX Z-PAK) 250 MG tablet    Sig: Take 2 tablets (500 mg) on  Day 1,  followed by 1 tablet (250 mg) once daily on Days 2 through 5.    Dispense:  6 each    Refill:  0    Order Specific Question:  Supervising Provider    Answer:  Merlyn Albert [[id number]]  . HYDROcodone-homatropine (HYCODAN) 5-1.5 MG/5ML syrup    Sig: Take 5 mLs by mouth every 4 (four) hours as needed.    Dispense:  120 mL    Refill:  0    Order Specific Question:  Supervising Provider    Answer:  Riccardo Dubin   Explained that this point after 48 hours Tamiflu would be of minimal benefit. Reviewed symptomatic care and warning signs. Call back by the end of the week if no improvement, sooner if worse.

## 2015-07-09 ENCOUNTER — Ambulatory Visit (HOSPITAL_COMMUNITY)
Admission: RE | Admit: 2015-07-09 | Discharge: 2015-07-09 | Disposition: A | Discharge status: Home / Self Care | Payer: BLUE CROSS/BLUE SHIELD | Source: Ambulatory Visit | Attending: Nurse Practitioner | Admitting: Nurse Practitioner

## 2015-07-09 DIAGNOSIS — K458 Other specified abdominal hernia without obstruction or gangrene: Secondary | ICD-10-CM | POA: Diagnosis present

## 2015-07-11 ENCOUNTER — Encounter: Payer: Self-pay | Admitting: Family Medicine

## 2015-07-21 ENCOUNTER — Other Ambulatory Visit: Payer: Self-pay | Admitting: General Surgery

## 2015-07-21 DIAGNOSIS — K432 Incisional hernia without obstruction or gangrene: Secondary | ICD-10-CM

## 2015-07-28 ENCOUNTER — Ambulatory Visit
Admission: RE | Admit: 2015-07-28 | Discharge: 2015-07-28 | Disposition: A | Discharge status: Home / Self Care | Payer: BLUE CROSS/BLUE SHIELD | Source: Ambulatory Visit | Attending: General Surgery | Admitting: General Surgery

## 2015-07-28 DIAGNOSIS — K432 Incisional hernia without obstruction or gangrene: Secondary | ICD-10-CM

## 2015-07-28 MED ORDER — IOPAMIDOL (ISOVUE-300) INJECTION 61%
100.0000 mL | Freq: Once | INTRAVENOUS | Status: AC | PRN
  Administered 2015-07-28: 100 mL via INTRAVENOUS

## 2016-02-10 ENCOUNTER — Other Ambulatory Visit (HOSPITAL_COMMUNITY)
Admission: RE | Admit: 2016-02-10 | Discharge: 2016-02-10 | Disposition: A | Discharge status: Home / Self Care | Payer: BLUE CROSS/BLUE SHIELD | Source: Ambulatory Visit | Attending: Adult Health | Admitting: Adult Health

## 2016-02-10 ENCOUNTER — Ambulatory Visit (INDEPENDENT_AMBULATORY_CARE_PROVIDER_SITE_OTHER): Payer: BLUE CROSS/BLUE SHIELD | Admitting: Adult Health

## 2016-02-10 ENCOUNTER — Encounter: Payer: Self-pay | Admitting: Adult Health

## 2016-02-10 VITALS — BP 122/90 | HR 90 | Ht 66.0 in | Wt 184.0 lb

## 2016-02-10 DIAGNOSIS — Z01419 Encounter for gynecological examination (general) (routine) without abnormal findings: Secondary | ICD-10-CM | POA: Diagnosis not present

## 2016-02-10 DIAGNOSIS — Z113 Encounter for screening for infections with a predominantly sexual mode of transmission: Secondary | ICD-10-CM | POA: Diagnosis present

## 2016-02-10 DIAGNOSIS — Z1151 Encounter for screening for human papillomavirus (HPV): Secondary | ICD-10-CM | POA: Insufficient documentation

## 2016-02-10 MED ORDER — IBUPROFEN 800 MG PO TABS: 800.0000 mg | ORAL_TABLET | Freq: Three times a day (TID) | ORAL | 1 refills | Status: DC | PRN

## 2016-02-10 NOTE — Patient Instructions (Addendum)
Physical in 1 year, pap in 3 if normal Labs with PCP

## 2016-02-10 NOTE — Progress Notes (Signed)
Patient ID: [name], female   DOB: 1981/05/10, 35 y.o.   MRN: [id number] History of Present Illness: Terry GravesBonita is a 35 year old black female in for a well woman gyn exam and pap. PCP is Dr [name].   Current Medications, Allergies, Past Medical History, Past Surgical History, Family History and Social History were reviewed in [facility] electronic medical record.     Review of Systems: Patient denies any headaches, hearing loss, fatigue, blurred vision, shortness of breath, chest pain, abdominal pain, problems with bowel movements, urination, or intercourse. No joint pain or mood swings.Has occasional vaginal irritation.    Physical Exam:BP 122/90 (BP Location: Left Arm, Patient Position: Sitting, Cuff Size: Normal)   Pulse 90   Ht 5\' 6"  (1.676 m)   Wt 184 lb (83.5 kg)   LMP 02/03/2016 (Exact Date)   BMI 29.70 kg/m  General:  Well developed, well nourished, no acute distress Skin:  Warm and dry Neck:  Midline trachea, normal thyroid, good ROM, no lymphadenopathy Lungs; Clear to auscultation bilaterally Breast:  No dominant palpable mass, retraction, or nipple discharge Cardiovascular: Regular rate and rhythm Abdomen:  Soft, non tender, no hepatosplenomegaly Pelvic:  External genitalia is normal in appearance, no lesions.  The vagina is normal in appearance, brown period blood in vault. Urethra has no lesions or masses. The cervix is bulbous. Pap with HPV and GC/CHL performed. Uterus is felt to be normal size, shape, and contour.  No adnexal masses or tenderness noted.Bladder is non tender, no masses felt. Extremities/musculoskeletal:  No swelling or varicosities noted, no clubbing or cyanosis Psych:  No mood changes, alert and cooperative,seems happy PHQ 2 score 0. She requested refill on motrin.  Impression:  1. Encounter for gynecological examination with Papanicolaou smear of cervix      Plan: Refilled Motrin 800 mg #60 take 1 every 8 hours prn with 1 refill   Physical in 1 year, pap in 3 if normal Labs with PCP

## 2016-02-11 ENCOUNTER — Ambulatory Visit (INDEPENDENT_AMBULATORY_CARE_PROVIDER_SITE_OTHER): Payer: BLUE CROSS/BLUE SHIELD | Admitting: *Deleted

## 2016-02-11 DIAGNOSIS — Z23 Encounter for immunization: Secondary | ICD-10-CM

## 2016-02-11 LAB — CYTOLOGY - PAP

## 2016-03-16 ENCOUNTER — Ambulatory Visit (INDEPENDENT_AMBULATORY_CARE_PROVIDER_SITE_OTHER): Payer: BLUE CROSS/BLUE SHIELD | Admitting: Adult Health

## 2016-03-16 ENCOUNTER — Encounter: Payer: Self-pay | Admitting: Adult Health

## 2016-03-16 VITALS — BP 134/90 | HR 74 | Ht 65.0 in | Wt 184.0 lb

## 2016-03-16 DIAGNOSIS — B9689 Other specified bacterial agents as the cause of diseases classified elsewhere: Secondary | ICD-10-CM

## 2016-03-16 DIAGNOSIS — N760 Acute vaginitis: Secondary | ICD-10-CM

## 2016-03-16 DIAGNOSIS — L298 Other pruritus: Secondary | ICD-10-CM

## 2016-03-16 DIAGNOSIS — N898 Other specified noninflammatory disorders of vagina: Secondary | ICD-10-CM | POA: Diagnosis not present

## 2016-03-16 LAB — POCT WET PREP (WET MOUNT)
Clue Cells Wet Prep Whiff POC: POSITIVE
TRICHOMONAS WET PREP HPF POC: ABSENT

## 2016-03-16 MED ORDER — METRONIDAZOLE 0.75 % VA GEL: 1.0000 | Freq: Every day | VAGINAL | 1 refills | Status: DC

## 2016-03-16 NOTE — Progress Notes (Signed)
Subjective:     Patient ID: Terry FeltyBonita Cruz, female   DOB: 1981/04/27, 35 y.o.   MRN: [id number]  HPI [name] is a 35 year old black female,married in complaining of vaginal discharge and itching and noticed fishy odor with sex, has occasional; sharp pain in vagina.  Review of Systems +vaginal discharge +vaginal itching + vaginal odor Occasional sharp pain in vagina Reviewed past medical,surgical, social and family history. Reviewed medications and allergies.     Objective:   Physical Exam BP 134/90 (BP Location: Left Arm, Patient Position: Sitting, Cuff Size: Normal)   Pulse 74   Ht 5\' 5"  (1.651 m)   Wt 184 lb (83.5 kg)   LMP 02/24/2016 (Exact Date)   BMI 30.62 kg/m    Skin warm and dry.Pelvic: external genitalia is normal in appearance no lesions, vagina: white discharge with odor,urethra has no lesions or masses noted, cervix:smooth and bulbous, uterus: normal size, shape and contour, non tender, no masses felt, adnexa: no masses or tenderness noted. Bladder is non tender and no masses felt. Wet prep: + for clue cells and +WBCs. GC/CHL obtained.  PHQ 2 score 0. Discussed that BV is overgrowth of bacteria in vagina when PH changes. Face time 15 minutes with 50% counseling.   Assessment:     1. Vaginal discharge   2. Vaginal itching   3. Vaginal odor   4. BV (bacterial vaginosis)       Plan:    GC/CHL sent Rx metrogel use 1 applicator in vagina when sleeps x 5 and 1 refill Can wash applicator and use if odor returns,Review handout on BV Follow up prn

## 2016-03-16 NOTE — Patient Instructions (Signed)

## 2016-03-18 LAB — GC/CHLAMYDIA PROBE AMP
Chlamydia trachomatis, NAA: NEGATIVE
NEISSERIA GONORRHOEAE BY PCR: NEGATIVE

## 2017-01-17 ENCOUNTER — Emergency Department (HOSPITAL_COMMUNITY)
Admission: EM | Admit: 2017-01-17 | Discharge: 2017-01-17 | Disposition: A | Discharge status: Home / Self Care | Payer: BLUE CROSS/BLUE SHIELD | Attending: Emergency Medicine | Admitting: Emergency Medicine

## 2017-01-17 ENCOUNTER — Encounter (HOSPITAL_COMMUNITY): Payer: Self-pay | Admitting: *Deleted

## 2017-01-17 DIAGNOSIS — R300 Dysuria: Secondary | ICD-10-CM | POA: Diagnosis present

## 2017-01-17 DIAGNOSIS — Z87891 Personal history of nicotine dependence: Secondary | ICD-10-CM | POA: Diagnosis not present

## 2017-01-17 DIAGNOSIS — R1033 Periumbilical pain: Secondary | ICD-10-CM | POA: Insufficient documentation

## 2017-01-17 DIAGNOSIS — Z79899 Other long term (current) drug therapy: Secondary | ICD-10-CM | POA: Diagnosis not present

## 2017-01-17 DIAGNOSIS — N390 Urinary tract infection, site not specified: Secondary | ICD-10-CM | POA: Insufficient documentation

## 2017-01-17 LAB — URINALYSIS, ROUTINE W REFLEX MICROSCOPIC
BILIRUBIN URINE: NEGATIVE
GLUCOSE, UA: NEGATIVE mg/dL
Ketones, ur: NEGATIVE mg/dL
NITRITE: POSITIVE — AB
PH: 5 (ref 5.0–8.0)
Protein, ur: 100 mg/dL — AB
SPECIFIC GRAVITY, URINE: 1.025 (ref 1.005–1.030)

## 2017-01-17 MED ORDER — PHENAZOPYRIDINE HCL 100 MG PO TABS
200.0000 mg | ORAL_TABLET | Freq: Once | ORAL | Status: AC
  Administered 2017-01-17: 200 mg via ORAL
  Filled 2017-01-17: qty 2

## 2017-01-17 MED ORDER — CEPHALEXIN 500 MG PO CAPS: 500.0000 mg | ORAL_CAPSULE | Freq: Three times a day (TID) | ORAL | 0 refills | Status: DC

## 2017-01-17 MED ORDER — CEPHALEXIN 500 MG PO CAPS
500.0000 mg | ORAL_CAPSULE | Freq: Once | ORAL | Status: AC
  Administered 2017-01-17: 500 mg via ORAL
  Filled 2017-01-17: qty 1

## 2017-01-17 MED ORDER — PHENAZOPYRIDINE HCL 200 MG PO TABS: 200.0000 mg | ORAL_TABLET | Freq: Three times a day (TID) | ORAL | 0 refills | Status: DC

## 2017-01-17 NOTE — ED Triage Notes (Signed)
Pt c/o lower abdominal pain and pain with urination that started tonight while at work

## 2017-01-17 NOTE — ED Provider Notes (Signed)
[facility] Provider Note   CSN: [id number] Arrival date & time: 01/17/17  0356  Time seen 05:36 AM  History   Chief Complaint Chief Complaint  Patient presents with  . Dysuria    HPI [name] is a 36 y.o. female.  HPI  patient states about midnight while at work she started having lower abdominal discomfort and dysuria with blood on the tissue when she wiped. She states there was no blood in the toilet water. She also describes frequency and urgency. She denies nausea, vomiting, flank pain, or fever. She states this feels like UTI she's had in the past, she states last time however was several years ago.  PCP Merlyn Albert, MD   Past Medical History:  Diagnosis Date  . BV (bacterial vaginosis) 12/30/2014  . Heart murmur   . Herpes simplex virus (HSV) infection   . Labial irritation 02/05/2015  . Menorrhagia 03/06/2013  . Menstrual cramps 02/05/2015  . Positive test for herpes simplex virus (HSV) antibody 12/31/2014  . Vaginal irritation 12/30/2014    Patient Active Problem List   Diagnosis Date Noted  . Vitamin D deficiency 07/04/2015  . Other specified abdominal hernia without obstruction or gangrene 07/03/2015  . Varicose veins of lower extremities with ulcer (HCC) 07/03/2015  . Labial irritation 02/05/2015  . Menstrual cramps 02/05/2015  . Positive test for herpes simplex virus (HSV) antibody 12/31/2014  . Vaginal irritation 12/30/2014  . BV (bacterial vaginosis) 12/30/2014  . Excessive or frequent menstruation 03/23/2013  . Dysmenorrhea 03/23/2013  . Menorrhagia 03/06/2013  . Abdominal muscle strain 03/06/2012  . Diastasis recti 03/06/2012  . Tobacco abuse 03/06/2012  . TONSILLITIS 05/15/2008  . AMENORRHEA 11/30/2006  . SYMPTOM, HEADACHE 11/30/2006  . PELVIC  PAIN 11/30/2006  . ELEVATED BLOOD PRESSURE 11/30/2006  . FIBROCYSTIC BREAST DISEASE 05/14/2006    Past Surgical History:  Procedure Laterality Date  . CESAREAN SECTION     x2,2006,2011  . DILITATION & CURRETTAGE/HYSTROSCOPY WITH THERMACHOICE ABLATION N/A 04/11/2013   Procedure: DILATATION & CURETTAGE/HYSTEROSCOPY WITH THERMACHOICE ABLATION;  Surgeon: Lazaro Arms, MD;  Location: [facility];  Service: Gynecology;  Laterality: N/A;  Total Ablation Therapy Time = 9 minutes 38 seconds; 86-88 deg C  . HERNIA REPAIR  2012  . LAPAROSCOPIC INCISIONAL / UMBILICAL / VENTRAL HERNIA REPAIR  09/2010   Dr. Lovell Sheehan  . TUBAL LIGATION  2011    OB History    Gravida Para Term Preterm AB Living   SAB TAB Ectopic Multiple Live Births           2       Home Medications    Prior to Admission medications   Medication Sig Start Date End Date Taking? Authorizing Provider  cephALEXin (KEFLEX) 500 MG capsule Take 1 capsule (500 mg total) by mouth 3 (three) times daily. 01/17/17   Devoria Albe, MD  ibuprofen (ADVIL,MOTRIN) 800 MG tablet Take 1 tablet (800 mg total) by mouth every 8 (eight) hours as needed. 02/10/16   Adline Potter, NP  phenazopyridine (PYRIDIUM) 200 MG tablet Take 1 tablet (200 mg total) by mouth 3 (three) times daily. 01/17/17   Devoria Albe, MD    Family History Family History  Problem Relation Age of Onset  . Cancer Paternal Uncle        unknown  . Cancer Paternal Grandmother        unknown  . Diabetes Maternal Grandfather   .  Alcohol abuse Paternal Grandfather     Social History Social History  Substance Use Topics  . Smoking status: Former Smoker    Packs/day: 0.25    Years: 5.00    Types: Cigarettes    Quit date: 04/03/2008  . Smokeless tobacco: Never Used  . Alcohol use 0.0 oz/week     Comment: occ  employed   Allergies   Patient has no known allergies.   Review of Systems Review of Systems  All other systems reviewed and are negative.    Physical Exam Updated Vital Signs BP 126/84 (BP Location: Left Arm)   Pulse 78   Temp 97.7 F (36.5 C) (Oral)   Resp 16   Ht  (1.651 m)   Wt 81.2 kg (179 lb)   LMP  01/05/2017   SpO2 100%   BMI 29.79 kg/m   Vital signs normal    Physical Exam  Constitutional: She is oriented to person, place, and time. She appears well-developed and well-nourished.  Non-toxic appearance. She does not appear ill. No distress.  HENT:  Head: Normocephalic and atraumatic.  Right Ear: External ear normal.  Left Ear: External ear normal.  Nose: Nose normal. No mucosal edema or rhinorrhea.  Mouth/Throat: Oropharynx is clear and moist and mucous membranes are normal. No dental abscesses or uvula swelling.  Eyes: Pupils are equal, round, and reactive to light. Conjunctivae and EOM are normal.  Neck: Normal range of motion and full passive range of motion without pain. Neck supple.  Cardiovascular: Normal rate, regular rhythm and normal heart sounds.  Exam reveals no gallop and no friction rub.   No murmur heard. Pulmonary/Chest: Effort normal and breath sounds normal. No respiratory distress. She has no wheezes. She has no rhonchi. She has no rales. She exhibits no tenderness and no crepitus.  Abdominal: Soft. Normal appearance and bowel sounds are normal. She exhibits no distension. There is tenderness. There is no rebound and no guarding.    No CVA tenderness  Musculoskeletal: Normal range of motion. She exhibits no edema or tenderness.  Moves all extremities well.   Neurological: She is alert and oriented to person, place, and time. She has normal strength. No cranial nerve deficit.  Skin: Skin is warm, dry and intact. No rash noted. No erythema. No pallor.  Psychiatric: She has a normal mood and affect. Her speech is normal and behavior is normal. Her mood appears not anxious.  Nursing note and vitals reviewed.    ED Treatments / Results  Labs (all labs ordered are listed, but only abnormal results are displayed) Results for orders placed or performed during the hospital encounter of 01/17/17  Urinalysis, Routine w reflex microscopic  Result Value Ref Range    Color, Urine YELLOW YELLOW   APPearance CLOUDY (A) CLEAR   Specific Gravity, Urine 1.025 1.005 - 1.030   pH 5.0 5.0 - 8.0   Glucose, UA NEGATIVE NEGATIVE mg/dL   Hgb urine dipstick LARGE (A) NEGATIVE   Bilirubin Urine NEGATIVE NEGATIVE   Ketones, ur NEGATIVE NEGATIVE mg/dL   Protein, ur 782 (A) NEGATIVE mg/dL   Nitrite POSITIVE (A) NEGATIVE   Leukocytes, UA LARGE (A) NEGATIVE   RBC / HPF TOO NUMEROUS TO COUNT 0 - 5 RBC/hpf   WBC, UA TOO NUMEROUS TO COUNT 0 - 5 WBC/hpf   Bacteria, UA RARE (A) NONE SEEN   Squamous Epithelial / LPF 6-30 (A) NONE SEEN   WBC Clumps PRESENT    Mucus PRESENT  Budding Yeast PRESENT    Laboratory interpretation all normal except UTI, urine culture sent    EKG  EKG Interpretation None       Radiology No results found.  Procedures Procedures (including critical care time)  Medications Ordered in ED Medications  cephALEXin (KEFLEX) capsule 500 mg (not administered)  phenazopyridine (PYRIDIUM) tablet 200 mg (not administered)     Initial Impression / Assessment and Plan / ED Course  I have reviewed the triage vital signs and the nursing notes.  Pertinent labs & imaging results that were available during my care of the patient were reviewed by me and considered in my medical decision making (see chart for details).     Patient was given oral Keflex and Pyridium for her UTI. We discussed reasons to return to the ED.  Final Clinical Impressions(s) / ED Diagnoses   Final diagnoses:  Lower urinary tract infectious disease    New Prescriptions New Prescriptions   CEPHALEXIN (KEFLEX) 500 MG CAPSULE    Take 1 capsule (500 mg total) by mouth 3 (three) times daily.   PHENAZOPYRIDINE (PYRIDIUM) 200 MG TABLET    Take 1 tablet (200 mg total) by mouth 3 (three) times daily.    Plan discharge  Devoria AlbeIva Mickael Mcnutt, MD, [facility] PyoFACEP    Demyah Smyre, MD 01/17/17 [phone]

## 2017-01-17 NOTE — Discharge Instructions (Signed)
Take the medications as prescribed. Recheck if you get a fever, flank pain, vomiting or you aren't improving over the next 48 hours.

## 2017-01-19 LAB — URINE CULTURE: Culture: >=100000 — AB

## 2017-01-21 ENCOUNTER — Telehealth: Payer: Self-pay | Admitting: Emergency Medicine

## 2017-01-21 NOTE — Telephone Encounter (Signed)
Post ED Visit - Positive Culture Follow-up  Culture report reviewed by antimicrobial stewardship pharmacist:   Enzo Bi, Pharm.D.  Celedonio Miyamoto, Pharm.D., BCPS AQ-ID  Garvin Fila, Pharm.D., BCPS  Georgina Pillion, Pharm.D., BCPS  [employer] [address]D., BCPS, AAHIVP  Estella Husk, Pharm.D., BCPS, AAHIVP  Lysle Pearl, PharmD, BCPS  Casilda Carls, PharmD, BCPS  Pollyann Samples, PharmD, BCPS  Positive urine culture Treated with cephalexin, organism sensitive to the same and no further patient follow-up is required at this time.  Berle Mull 01/21/2017, 8:30 AM

## 2017-01-28 ENCOUNTER — Ambulatory Visit (INDEPENDENT_AMBULATORY_CARE_PROVIDER_SITE_OTHER): Payer: BLUE CROSS/BLUE SHIELD | Admitting: Family Medicine

## 2017-01-28 VITALS — BP 116/82 | Temp 98.2°F | Ht 65.0 in | Wt 180.6 lb

## 2017-01-28 DIAGNOSIS — B9689 Other specified bacterial agents as the cause of diseases classified elsewhere: Secondary | ICD-10-CM | POA: Diagnosis not present

## 2017-01-28 DIAGNOSIS — J069 Acute upper respiratory infection, unspecified: Secondary | ICD-10-CM

## 2017-01-28 DIAGNOSIS — J329 Chronic sinusitis, unspecified: Secondary | ICD-10-CM

## 2017-01-28 MED ORDER — AMOXICILLIN-POT CLAVULANATE 875-125 MG PO TABS: 1.0000 | ORAL_TABLET | Freq: Two times a day (BID) | ORAL | 0 refills | Status: DC

## 2017-01-28 NOTE — Progress Notes (Signed)
   Subjective:    Patient ID: [name], female    DOB: 13-Jun-1980, 36 y.o.   MRN: [id number]  Cough  This is a new problem. The current episode started in the past 7 days. Associated symptoms include nasal congestion. Treatments tried: otc meds.  started feeling weak and achey  Cough was productive with diminished energy  Phlegm with some blood in it  Dim energy, felt weak  Frontal headache worse with presure, dim energy     Alka selter cough cod preparations   Kids had cough and cong and runny nose and di eenrgy      Review of Systems  Respiratory: Positive for cough.        Objective:   Physical Exam  Alert, mild malaise. Hydration good Vitals stable. frontal/ maxillary tenderness evident positive nasal congestion. pharynx normal neck supple  lungs clear/no crackles or wheezes. heart regular in rhythm       Assessment & Plan:  Impression rhinosinusitis likely post viral, discussed with patient. plan antibiotics prescribed. Questions answered. Symptomatic care discussed. warning signs discussed. WSL

## 2017-02-10 ENCOUNTER — Ambulatory Visit (INDEPENDENT_AMBULATORY_CARE_PROVIDER_SITE_OTHER): Payer: BLUE CROSS/BLUE SHIELD | Admitting: Adult Health

## 2017-02-10 ENCOUNTER — Encounter: Payer: Self-pay | Admitting: Adult Health

## 2017-02-10 VITALS — BP 130/90 | HR 80 | Ht 65.0 in | Wt 189.3 lb

## 2017-02-10 DIAGNOSIS — Z01419 Encounter for gynecological examination (general) (routine) without abnormal findings: Secondary | ICD-10-CM

## 2017-02-10 NOTE — Progress Notes (Signed)
Patient ID: [name], female   DOB: 31-Oct-1980, 36 y.o.   MRN: [id number] History of Present Illness: Chirstina is a 36 year old black female in for a well woman gyn exam, she had normal pap with negative HPV 02/10/16.She says she was recently treated for UTI and sinus infection, but is good now, she works 3rd shift.  PCP is Lubertha South.   Current Medications, Allergies, Past Medical History, Past Surgical History, Family History and Social History were reviewed in [name] record.     Review of Systems: Patient denies any headaches, hearing loss, fatigue, blurred vision, shortness of breath, chest pain, abdominal pain, problems with bowel movements, urination, or intercourse. No joint pain or mood swings.    Physical Exam: BP 130/90 (BP Location: Left Arm, Patient Position: Sitting, Cuff Size: Small)   Pulse 80   Ht  (1.651 m)   Wt 189 lb 4.8 oz (85.9 kg)   LMP 02/06/2017   BMI 31.50 kg/m  General:  Well developed, well nourished, no acute distress Skin:  Warm and dry Neck:  Midline trachea, normal thyroid, good ROM, no lymphadenopathy Lungs; Clear to auscultation bilaterally Breast:  No dominant palpable mass, retraction, or nipple discharge Cardiovascular: Regular rate and rhythm Abdomen:  Soft, non tender, no hepatosplenomegaly Pelvic:  External genitalia is normal in appearance, no lesions.  The vagina is normal in appearance. Urethra has no lesions or masses. The cervix is bulbous.  Uterus is felt to be normal size, shape, and contour.  No adnexal masses or tenderness noted.Bladder is non tender, no masses felt. Extremities/musculoskeletal:  No swelling or varicosities noted, no clubbing or cyanosis Psych:  No mood changes, alert and cooperative,seems happy PHQ 2 score 0.  Impression:  1. Encounter for well woman exam with routine gynecological exam      Plan: Physical in 1 year Pap in 2020 Labs with PCP

## 2017-02-10 NOTE — Patient Instructions (Addendum)
Physical in 1 year Pap in 2020 Labs with PCP

## 2017-03-22 ENCOUNTER — Ambulatory Visit (INDEPENDENT_AMBULATORY_CARE_PROVIDER_SITE_OTHER): Payer: BLUE CROSS/BLUE SHIELD

## 2017-03-22 ENCOUNTER — Telehealth: Payer: Self-pay | Admitting: Family Medicine

## 2017-03-22 DIAGNOSIS — Z23 Encounter for immunization: Secondary | ICD-10-CM

## 2017-03-22 DIAGNOSIS — R202 Paresthesia of skin: Secondary | ICD-10-CM

## 2017-03-22 DIAGNOSIS — R030 Elevated blood-pressure reading, without diagnosis of hypertension: Secondary | ICD-10-CM

## 2017-03-22 NOTE — Telephone Encounter (Signed)
Spoke with patient and informed her per Dr.Steve Luking-  Labs have been ordered. Needs to be fasting. Patient verbalized understanding.

## 2017-03-22 NOTE — Telephone Encounter (Signed)
Requesting order for labs.  She is having itching in tingling in hands and feet.

## 2017-03-22 NOTE — Telephone Encounter (Signed)
She has an appointment on 04/11/17 with Eber Jonesarolyn.

## 2017-03-22 NOTE — Telephone Encounter (Signed)
Lip liv m7 tsh cbc 

## 2017-03-25 ENCOUNTER — Other Ambulatory Visit: Payer: Self-pay | Admitting: Adult Health

## 2017-04-11 ENCOUNTER — Ambulatory Visit: Payer: BLUE CROSS/BLUE SHIELD | Admitting: Nurse Practitioner

## 2017-04-11 ENCOUNTER — Encounter: Payer: Self-pay | Admitting: Nurse Practitioner

## 2017-04-11 VITALS — BP 138/94 | Ht 66.0 in | Wt 180.6 lb

## 2017-04-11 DIAGNOSIS — L299 Pruritus, unspecified: Secondary | ICD-10-CM | POA: Diagnosis not present

## 2017-04-11 DIAGNOSIS — F430 Acute stress reaction: Secondary | ICD-10-CM | POA: Diagnosis not present

## 2017-04-11 DIAGNOSIS — F411 Generalized anxiety disorder: Secondary | ICD-10-CM

## 2017-04-11 MED ORDER — CLONAZEPAM 0.5 MG PO TABS: ORAL_TABLET | ORAL | 0 refills | Status: DC

## 2017-04-11 NOTE — Progress Notes (Signed)
Subjective: Presents for complaints of itching on the hands and feet that began about a month ago.  Started on the feet but now has spread to both of her hands.  Slight redness at times but no bumps or lesions.  Occurs about 4 out of 7 days of the week, last about an hour.  Has not identified any specific trigger.  No new foods or hygiene products.  Resolved after taking a Benadryl x1.  Slight relief with OTC lotion.  Describes extreme stress in her life, working third shift has children at home and is taking care of her mother during the day instead of sleeping.  Describes her stress is 8/10.  Gets 4-5 hours of sleep if she is lucky.  Increased stress began a few weeks ago.  No difficulty swallowing.  No wheezing or trouble breathing.  Objective:   BP (!) 138/94   Ht 5\' 6"  (1.676 m)   Wt 180 lb 9.6 oz (81.9 kg)   BMI 29.15 kg/m  NAD.  Alert, oriented.  Mildly anxious affect.  Thoughts logical coherent and relevant.  Dressed appropriately.  Making good eye contact.  Lungs clear.  Heart regular rate and rhythm.  Hands and feet no lesions or erythema noted.  Assessment: Pruritus  Anxiety as acute reaction to exceptional stress    Plan:   Meds ordered this encounter  Medications  . clonazePAM (KLONOPIN) 0.5 MG tablet    Sig: Take 1/2 - 1 tab po BID prn anxiety    Dispense:  30 tablet    Refill:  0    Order Specific Question:   Supervising Provider    Answer:   Merlyn AlbertLUKING, WILLIAM S [[id number]]   Discussed options regarding her anxiety.  Defers daily medicine at this time.  Lengthy discussion regarding the importance of reducing her stress level and adequate rest.  Based on the symptomatology and timing the itching is most likely related to her anxiety level.  Start Claritin with Pepcid daily.  Benadryl at bedtime when she can.  Warning signs reviewed.  Patient to call back if further problems with rash or itching or if no improvements in her anxiety.

## 2017-04-11 NOTE — Patient Instructions (Signed)
Claritin and Pepcid daily Take Benadryl when you can at bedtime

## 2017-04-15 ENCOUNTER — Encounter: Payer: Self-pay | Admitting: Family Medicine

## 2017-04-15 ENCOUNTER — Ambulatory Visit: Payer: BLUE CROSS/BLUE SHIELD | Admitting: Family Medicine

## 2017-04-15 VITALS — Temp 98.9°F | Wt 179.0 lb

## 2017-04-15 DIAGNOSIS — R300 Dysuria: Secondary | ICD-10-CM | POA: Diagnosis not present

## 2017-04-15 DIAGNOSIS — B338 Other specified viral diseases: Secondary | ICD-10-CM

## 2017-04-15 DIAGNOSIS — J019 Acute sinusitis, unspecified: Secondary | ICD-10-CM | POA: Diagnosis not present

## 2017-04-15 DIAGNOSIS — B348 Other viral infections of unspecified site: Secondary | ICD-10-CM

## 2017-04-15 LAB — POCT URINALYSIS DIPSTICK
PH UA: 5 (ref 5.0–8.0)
Spec Grav, UA: 1.025 (ref 1.010–1.025)

## 2017-04-15 MED ORDER — AZITHROMYCIN 250 MG PO TABS: ORAL_TABLET | ORAL | 0 refills | Status: DC

## 2017-04-15 NOTE — Progress Notes (Signed)
   Subjective:    Patient ID: [name], female    DOB: May 02, 1981, 36 y.o.   MRN: [id number]  Sinusitis  This is a new problem. Episode onset: 2 days. Associated symptoms include ear pain and a sore throat. (Body aches)  Body aches feeling bad rundown relates ear pain discomfort shortness sore throat body aches sweats chills fevers no high fevers some cough congestion no vomiting diarrhea painful urination since yesterday. PMH benign  Review of Systems  HENT: Positive for ear pain and sore throat.        Objective:   Physical Exam Eardrums normal mild sinus tenderness no clear heart regular abdomen soft  Patient complained of slight sensation with urination but urine appears normal under the microscope     Assessment & Plan:  Viral syndrome Pharyngitis viral Parainfluenza illness Should gradually get better Antibiotics prescribed for sinus infection Warnings were discussed

## 2017-08-17 ENCOUNTER — Other Ambulatory Visit: Payer: Self-pay | Admitting: *Deleted

## 2017-08-17 ENCOUNTER — Telehealth: Payer: Self-pay | Admitting: Family Medicine

## 2017-08-17 MED ORDER — SCOPOLAMINE 1 MG/3DAYS TD PT72: 1.0000 | MEDICATED_PATCH | TRANSDERMAL | 0 refills | Status: DC

## 2017-08-17 NOTE — Telephone Encounter (Signed)
Transderm scop patch #4 apply one patch every 3 days per dr Brett Canalessteve. Med sent to pharm. Pt notified.

## 2017-08-17 NOTE — Telephone Encounter (Signed)
Patient is requesting Rx for motion sickness patches to pick up today.  She is going on a cruise tomorrow.  [facility] on [address]

## 2017-12-10 ENCOUNTER — Emergency Department (HOSPITAL_COMMUNITY)
Admission: EM | Admit: 2017-12-10 | Discharge: 2017-12-10 | Disposition: A | Discharge status: Home / Self Care | Payer: BLUE CROSS/BLUE SHIELD | Attending: Emergency Medicine | Admitting: Emergency Medicine

## 2017-12-10 ENCOUNTER — Other Ambulatory Visit: Payer: Self-pay

## 2017-12-10 ENCOUNTER — Encounter (HOSPITAL_COMMUNITY): Payer: Self-pay | Admitting: Emergency Medicine

## 2017-12-10 DIAGNOSIS — R42 Dizziness and giddiness: Secondary | ICD-10-CM | POA: Insufficient documentation

## 2017-12-10 DIAGNOSIS — Z87891 Personal history of nicotine dependence: Secondary | ICD-10-CM | POA: Diagnosis not present

## 2017-12-10 DIAGNOSIS — E876 Hypokalemia: Secondary | ICD-10-CM | POA: Diagnosis not present

## 2017-12-10 LAB — CBC WITH DIFFERENTIAL/PLATELET
Basophils Absolute: 0 10*3/uL (ref 0.0–0.1)
Basophils Relative: 0 %
Eosinophils Absolute: 0 10*3/uL (ref 0.0–0.7)
Eosinophils Relative: 0 %
HEMATOCRIT: 38 % (ref 36.0–46.0)
HEMOGLOBIN: 13 g/dL (ref 12.0–15.0)
LYMPHS ABS: 2.5 10*3/uL (ref 0.7–4.0)
Lymphocytes Relative: 36 %
MCH: 30.3 pg (ref 26.0–34.0)
MCHC: 34.2 g/dL (ref 30.0–36.0)
MCV: 88.6 fL (ref 78.0–100.0)
MONOS PCT: 7 %
Monocytes Absolute: 0.5 10*3/uL (ref 0.1–1.0)
NEUTROS ABS: 3.9 10*3/uL (ref 1.7–7.7)
NEUTROS PCT: 57 %
Platelets: 258 10*3/uL (ref 150–400)
RBC: 4.29 MIL/uL (ref 3.87–5.11)
RDW: 12.6 % (ref 11.5–15.5)
WBC: 6.9 10*3/uL (ref 4.0–10.5)

## 2017-12-10 LAB — URINALYSIS, ROUTINE W REFLEX MICROSCOPIC
Bilirubin Urine: NEGATIVE
GLUCOSE, UA: NEGATIVE mg/dL
HGB URINE DIPSTICK: NEGATIVE
Ketones, ur: 20 mg/dL — AB
Leukocytes, UA: NEGATIVE
Nitrite: NEGATIVE
Protein, ur: NEGATIVE mg/dL
SPECIFIC GRAVITY, URINE: 1.01 (ref 1.005–1.030)
pH: 6 (ref 5.0–8.0)

## 2017-12-10 LAB — BASIC METABOLIC PANEL
ANION GAP: 5 (ref 5–15)
BUN: 12 mg/dL (ref 6–20)
CO2: 28 mmol/L (ref 22–32)
CREATININE: 0.82 mg/dL (ref 0.44–1.00)
Calcium: 8.6 mg/dL — ABNORMAL LOW (ref 8.9–10.3)
Chloride: 105 mmol/L (ref 98–111)
GFR calc non Af Amer: >60 mL/min (ref >60)
Glucose, Bld: 86 mg/dL (ref 70–99)
Potassium: 2.9 mmol/L — ABNORMAL LOW (ref 3.5–5.1)
Sodium: 138 mmol/L (ref 135–145)

## 2017-12-10 LAB — PREGNANCY, URINE: Preg Test, Ur: NEGATIVE

## 2017-12-10 MED ORDER — MECLIZINE HCL 25 MG PO TABS: 25.0000 mg | ORAL_TABLET | Freq: Three times a day (TID) | ORAL | 0 refills | Status: DC | PRN

## 2017-12-10 MED ORDER — ONDANSETRON HCL 4 MG/2ML IJ SOLN
4.0000 mg | Freq: Once | INTRAMUSCULAR | Status: AC
  Administered 2017-12-10: 4 mg via INTRAVENOUS
  Filled 2017-12-10: qty 2

## 2017-12-10 MED ORDER — MECLIZINE HCL 12.5 MG PO TABS
25.0000 mg | ORAL_TABLET | Freq: Once | ORAL | Status: AC
  Administered 2017-12-10: 25 mg via ORAL
  Filled 2017-12-10: qty 2

## 2017-12-10 MED ORDER — SODIUM CHLORIDE 0.9 % IV BOLUS
1000.0000 mL | Freq: Once | INTRAVENOUS | Status: AC
  Administered 2017-12-10: 1000 mL via INTRAVENOUS

## 2017-12-10 MED ORDER — POTASSIUM CHLORIDE 10 MEQ/100ML IV SOLN
10.0000 meq | INTRAVENOUS | Status: AC
  Administered 2017-12-10 (×2): 10 meq via INTRAVENOUS
  Filled 2017-12-10 (×3): qty 100

## 2017-12-10 MED ORDER — POTASSIUM CHLORIDE CRYS ER 20 MEQ PO TBCR
40.0000 meq | EXTENDED_RELEASE_TABLET | Freq: Once | ORAL | Status: AC
  Administered 2017-12-10: 40 meq via ORAL
  Filled 2017-12-10: qty 2

## 2017-12-10 NOTE — ED Triage Notes (Signed)
Patient c/o sudden onset of dizziness that started approx 1 hour ago while standing still. Per patient worse with laying and closing eyes. Denies any weakness, slurred speech, headache, or facial drooping. Patient does report some nausea.

## 2017-12-10 NOTE — ED Provider Notes (Signed)
[facility] EMERGENCY DEPARTMENT Provider Note   CSN: [id number] Arrival date & time: 12/10/17  1721     History   Chief Complaint Chief Complaint  Patient presents with  . Dizziness    HPI [name] is a 37 y.o. female.  She is complaining of acute onset of dizziness about an hour ago.  She states she feels like things are spinning and is worse when she closes her eyes.  Is also exacerbated by turning her head.  Associated with some nausea.  There is a little bit of blurry vision but no double vision.  She states she was just sitting down to watch TV when this happened.  She had something somewhat similar when she had her Bell's palsy but she does not notice any facial droop or any other symptoms with this.  No recent illness.  No chest pain or shortness of breath no headache no cough no vomiting or diarrhea.  States no new medications.  Denies any drugs.  No numbness or weakness.  The history is provided by the patient.  Dizziness  Quality:  Head spinning and room spinning Severity:  Moderate Onset quality:  Sudden Duration:  1 hour Timing:  Constant Progression:  Unchanged Chronicity:  New Context: eye movement and head movement   Relieved by:  Being still Worsened by:  Closing eyes and movement Associated symptoms: nausea and vision changes (sl blurry - none now)   Associated symptoms: no chest pain, no diarrhea, no headaches, no hearing loss, no palpitations, no shortness of breath, no syncope, no tinnitus, no vomiting and no weakness   Risk factors: no heart disease, no hx of stroke, no hx of vertigo, no multiple medications and no new medications     Past Medical History:  Diagnosis Date  . BV (bacterial vaginosis) 12/30/2014  . Heart murmur   . Herpes simplex virus (HSV) infection   . Labial irritation 02/05/2015  . Menorrhagia 03/06/2013  . Menstrual cramps 02/05/2015  . Positive test for herpes simplex virus (HSV) antibody 12/31/2014  . Vaginal irritation  12/30/2014    Patient Active Problem List   Diagnosis Date Noted  . Vitamin D deficiency 07/04/2015  . Other specified abdominal hernia without obstruction or gangrene 07/03/2015  . Varicose veins of lower extremities with ulcer (HCC) 07/03/2015  . Labial irritation 02/05/2015  . Menstrual cramps 02/05/2015  . Positive test for herpes simplex virus (HSV) antibody 12/31/2014  . Vaginal irritation 12/30/2014  . BV (bacterial vaginosis) 12/30/2014  . Excessive or frequent menstruation 03/23/2013  . Dysmenorrhea 03/23/2013  . Menorrhagia 03/06/2013  . Abdominal muscle strain 03/06/2012  . Diastasis recti 03/06/2012  . Tobacco abuse 03/06/2012  . TONSILLITIS 05/15/2008  . AMENORRHEA 11/30/2006  . SYMPTOM, HEADACHE 11/30/2006  . PELVIC  PAIN 11/30/2006  . ELEVATED BLOOD PRESSURE 11/30/2006  . FIBROCYSTIC BREAST DISEASE 05/14/2006    Past Surgical History:  Procedure Laterality Date  . CESAREAN SECTION     x2,2006,2011  . DILITATION & CURRETTAGE/HYSTROSCOPY WITH THERMACHOICE ABLATION N/A 04/11/2013   Procedure: DILATATION & CURETTAGE/HYSTEROSCOPY WITH THERMACHOICE ABLATION;  Surgeon: Lazaro Arms, MD;  Location: [facility];  Service: Gynecology;  Laterality: N/A;  Total Ablation Therapy Time = 9 minutes 38 seconds; 86-88 deg C  . HERNIA REPAIR  2012  . LAPAROSCOPIC INCISIONAL / UMBILICAL / VENTRAL HERNIA REPAIR  09/2010   Dr. Lovell Sheehan  . TUBAL LIGATION  2011     OB History    Gravida  2   Para  2   Term      Preterm      AB      Living  2     SAB      TAB      Ectopic      Multiple      Live Births  2            Home Medications    Prior to Admission medications   Medication Sig Start Date End Date Taking? Authorizing Provider  azithromycin (ZITHROMAX Z-PAK) 250 MG tablet Take 2 tablets (500 mg) on  Day 1,  followed by 1 tablet (250 mg) once daily on Days 2 through 5. 04/15/17   Gerda DissLuking, Jonna CoupScott A, MD  clonazePAM (KLONOPIN) 0.5 MG tablet Take 1/2 - 1 tab po BID  prn anxiety 04/11/17   Campbell RichesHoskins, Carolyn C, NP  scopolamine (TRANSDERM-SCOP, 1.5 MG,) 1 MG/3DAYS Place 1 patch (1.5 mg total) onto the skin every 3 (three) days. 08/17/17   Merlyn AlbertLuking, William S, MD    Family History Family History  Problem Relation Age of Onset  . Cancer Paternal Uncle        unknown  . Cancer Paternal Grandmother        unknown  . Diabetes Maternal Grandfather   . Alcohol abuse Paternal Grandfather     Social History Social History   Tobacco Use  . Smoking status: Former Smoker    Packs/day: 0.25    Years: 5.00    Pack years: 1.25    Types: Cigarettes    Last attempt to quit: 04/03/2008    Years since quitting: 9.6  . Smokeless tobacco: Never Used  Substance Use Topics  . Alcohol use: Yes    Alcohol/week: 0.0 oz    Comment: occ  . Drug use: No     Allergies   Patient has no known allergies.   Review of Systems Review of Systems  Constitutional: Negative for fever.  HENT: Negative for hearing loss, sore throat and tinnitus.   Eyes: Negative for photophobia.  Respiratory: Negative for shortness of breath.   Cardiovascular: Negative for chest pain, palpitations and syncope.  Gastrointestinal: Positive for nausea. Negative for abdominal pain, diarrhea and vomiting.  Genitourinary: Negative for dysuria.  Musculoskeletal: Negative for back pain.  Skin: Negative for rash.  Neurological: Positive for dizziness. Negative for syncope, weakness and headaches.     Physical Exam Updated Vital Signs BP (!) 153/101 (BP Location: Right Arm)   Pulse 69   Temp 97.7 F (36.5 C) (Oral)   Resp 18   Ht 5\' 5"  (1.651 m)   Wt 81.6 kg (180 lb)   LMP 11/26/2017   SpO2 100%   BMI 29.95 kg/m   Physical Exam  Constitutional: She is oriented to person, place, and time. She appears well-developed and well-nourished. No distress.  HENT:  Head: Normocephalic and atraumatic.  Right Ear: Tympanic membrane, external ear and ear canal normal.  Left Ear: Tympanic  membrane, external ear and ear canal normal.  Nose: Nose normal.  Mouth/Throat: Oropharynx is clear and moist.  Eyes: Pupils are equal, round, and reactive to light. Conjunctivae are normal. Right eye exhibits nystagmus. Left eye exhibits nystagmus.  Neck: Neck supple.  Cardiovascular: Normal rate, regular rhythm, normal heart sounds and intact distal pulses.  No murmur heard. Pulmonary/Chest: Effort normal and breath sounds normal. No respiratory distress.  Abdominal: Soft. She exhibits no mass. There is no tenderness. There is no guarding.  Musculoskeletal: Normal range of  motion. She exhibits no edema, tenderness or deformity.  Neurological: She is alert and oriented to person, place, and time. She has normal strength. No cranial nerve deficit or sensory deficit. GCS eye subscore is 4. GCS verbal subscore is 5. GCS motor subscore is 6.  Skin: Skin is warm and dry. Capillary refill takes less than 2 seconds.  Psychiatric: She has a normal mood and affect.  Nursing note and vitals reviewed.    ED Treatments / Results  Labs (all labs ordered are listed, but only abnormal results are displayed) Labs Reviewed  BASIC METABOLIC PANEL - Abnormal; Notable for the following components:      Result Value   Potassium 2.9 (*)    Calcium 8.6 (*)    All other components within normal limits  URINALYSIS, ROUTINE W REFLEX MICROSCOPIC - Abnormal; Notable for the following components:   Ketones, ur 20 (*)    All other components within normal limits  CBC WITH DIFFERENTIAL/PLATELET  PREGNANCY, URINE    EKG None  Radiology No results found.  Procedures Procedures (including critical care time)  Medications Ordered in ED Medications  meclizine (ANTIVERT) tablet 25 mg (25 mg Oral Given 12/10/17 1753)  ondansetron (ZOFRAN) injection 4 mg (4 mg Intravenous Given 12/10/17 1749)  sodium chloride 0.9 % bolus 1,000 mL (0 mLs Intravenous Stopped 12/10/17 1829)  potassium chloride 10 mEq in 100 mL IVPB  (0 mEq Intravenous Stopped 12/10/17 2019)  potassium chloride SA (K-DUR,KLOR-CON) CR tablet 40 mEq (40 mEq Oral Given 12/10/17 1908)  sodium chloride 0.9 % bolus 1,000 mL (0 mLs Intravenous Stopped 12/10/17 2123)     Initial Impression / Assessment and Plan / ED Course  I have reviewed the triage vital signs and the nursing notes.  Pertinent labs & imaging results that were available during my care of the patient were reviewed by me and considered in my medical decision making (see chart for details).  Clinical Course as of Dec 11 1099  Sat Dec 10, 2017  1610 Patient still does not feel much better despite meclizine and IV fluids.  Her lab work was significant for some ketones in the urine and a low potassium.  She is been up and able to [facility] to the bathroom without any difficulty.  We will continue to hydrate her and get some more potassium into her.   [MB]  2243 Reevaluated patient states she is feeling improved.  She is been able to get up and ambulate without much dizziness.  Recommended that we discharge her on some oral meclizine and have her return if any worsening symptoms.   [MB]    Clinical Course User Index [MB] Terrilee Files, MD     Final Clinical Impressions(s) / ED Diagnoses   Final diagnoses:  Dizziness  Hypokalemia    ED Discharge Orders    None       Terrilee Files, MD 12/11/17 1101

## 2017-12-10 NOTE — Discharge Instructions (Addendum)
Your evaluated in the emergency department for dizziness.  This is likely vertigo and usually will improve with time.  We are prescribing you meclizine which may help limit your symptoms.  Please return if you have any worsening symptoms or any new findings.

## 2018-02-10 ENCOUNTER — Ambulatory Visit: Payer: BLUE CROSS/BLUE SHIELD | Admitting: Family Medicine

## 2018-02-15 ENCOUNTER — Ambulatory Visit: Payer: Self-pay | Admitting: Family Medicine

## 2018-02-21 ENCOUNTER — Ambulatory Visit: Payer: Self-pay | Admitting: Family Medicine

## 2018-02-22 ENCOUNTER — Encounter: Payer: Self-pay | Admitting: Family Medicine

## 2018-02-22 ENCOUNTER — Ambulatory Visit: Payer: BLUE CROSS/BLUE SHIELD | Admitting: Family Medicine

## 2018-02-22 VITALS — BP 134/90 | Temp 98.1°F | Ht 65.0 in | Wt 177.4 lb

## 2018-02-22 DIAGNOSIS — R42 Dizziness and giddiness: Secondary | ICD-10-CM | POA: Diagnosis not present

## 2018-02-22 DIAGNOSIS — H938X3 Other specified disorders of ear, bilateral: Secondary | ICD-10-CM

## 2018-02-22 DIAGNOSIS — E876 Hypokalemia: Secondary | ICD-10-CM

## 2018-02-22 MED ORDER — AZELASTINE HCL 0.1 % NA SOLN: NASAL | 5 refills | Status: DC

## 2018-02-22 NOTE — Progress Notes (Signed)
   Subjective:    Patient ID: [name], female    DOB: 1981-01-12, 37 y.o.   MRN: [id number]  Otalgia   There is pain in both ears. Chronicity: 3 weeks. Associated symptoms include rhinorrhea. Pertinent negatives include no coughing, ear discharge, sore throat or vomiting. Associated symptoms comments: vertigo. She has tried nothing for the symptoms.   Reports was seen in the ED for vertigo on 12/10/17, has been having bilateral ear pressure since then with intermittent dizziness, lasts only 1-2 minutes. Pt states she stands still until it goes away. Reports was given meclizine Rx in the ED, took it for about 1 week and found it to be somewhat helpful.   Reports right side of nose is runny, clear discharge, has been ongoing for many months. Otherwise denies any cold or upper respiratory symptoms.   Denies allergy symptoms.  Reports restarted smoking in the last few months.  Review of Systems  HENT: Positive for ear pain (pressure) and rhinorrhea. Negative for congestion, ear discharge, sore throat and tinnitus.   Eyes: Negative for visual disturbance.  Respiratory: Negative for cough.   Gastrointestinal: Negative for nausea and vomiting.  Neurological: Positive for dizziness. Negative for weakness.       Objective:   Physical Exam  Constitutional: She is oriented to person, place, and time. She appears well-developed and well-nourished. No distress.  HENT:  Head: Normocephalic and atraumatic.  Right Ear: Tympanic membrane normal.  Left Ear: Tympanic membrane normal.  Nose: Mucosal edema present.  Mouth/Throat: Uvula is midline. Tonsils are 1+ on the right. Tonsils are 2+ on the left. No tonsillar exudate.  Eyes: Pupils are equal, round, and reactive to light. EOM are normal. Right eye exhibits no discharge. Left eye exhibits no discharge.  Neck: Neck supple.  Cardiovascular: Normal rate, regular rhythm and normal heart sounds.  Pulmonary/Chest: Effort normal and breath sounds  normal. No respiratory distress.  Lymphadenopathy:    She has cervical adenopathy (mild anterior cervical adenopathy on left side, some tenderness).  Neurological: She is alert and oriented to person, place, and time.  Skin: Skin is warm and dry.  Psychiatric: She has a normal mood and affect.  Nursing note and vitals reviewed.     Assessment & Plan:  1. Vertigo  2. Ear pressure, bilateral - Plan: Ambulatory referral to ENT  3. Hypokalemia - Plan: Basic metabolic panel   Referral to ENT for further evaluation of ongoing vertigo and ear pressure. Pt states she does not need a refill on her meclizine, may take prn. Rx sent in for astelin nasal spray to help with her right-sided rhinorrhea. When pt was seen in ED in august potassium was 2.9, will repeat met 7 today to check that this has resolved and will notify pt of results.  Dr. Richardson Landry was consulted on this visit, he also examined the pt and is in agreement with the above treatment plan.

## 2018-02-23 LAB — BASIC METABOLIC PANEL
BUN / CREAT RATIO: 18 (ref 9–23)
BUN: 15 mg/dL (ref 6–20)
CO2: 25 mmol/L (ref 20–29)
CREATININE: 0.82 mg/dL (ref 0.57–1.00)
Calcium: 9.5 mg/dL (ref 8.7–10.2)
Chloride: 101 mmol/L (ref 96–106)
GFR, EST AFRICAN AMERICAN: 106 mL/min/{1.73_m2} (ref >59)
GFR, EST NON AFRICAN AMERICAN: 92 mL/min/{1.73_m2} (ref >59)
GLUCOSE: 79 mg/dL (ref 65–99)
Potassium: 3.4 mmol/L — ABNORMAL LOW (ref 3.5–5.2)
SODIUM: 140 mmol/L (ref 134–144)

## 2018-03-02 ENCOUNTER — Encounter: Payer: Self-pay | Admitting: Family Medicine

## 2018-03-15 ENCOUNTER — Encounter: Payer: Self-pay | Admitting: Family Medicine

## 2018-03-20 ENCOUNTER — Encounter: Payer: Self-pay | Admitting: Family Medicine

## 2018-04-20 ENCOUNTER — Encounter: Payer: Self-pay | Admitting: Adult Health

## 2018-04-20 ENCOUNTER — Ambulatory Visit (INDEPENDENT_AMBULATORY_CARE_PROVIDER_SITE_OTHER): Payer: BLUE CROSS/BLUE SHIELD | Admitting: Adult Health

## 2018-04-20 VITALS — BP 140/90 | HR 60 | Ht 65.0 in | Wt 184.0 lb

## 2018-04-20 DIAGNOSIS — R030 Elevated blood-pressure reading, without diagnosis of hypertension: Secondary | ICD-10-CM

## 2018-04-20 DIAGNOSIS — Z01419 Encounter for gynecological examination (general) (routine) without abnormal findings: Secondary | ICD-10-CM | POA: Insufficient documentation

## 2018-04-20 DIAGNOSIS — N921 Excessive and frequent menstruation with irregular cycle: Secondary | ICD-10-CM | POA: Insufficient documentation

## 2018-04-20 MED ORDER — MEGESTROL ACETATE 40 MG PO TABS: ORAL_TABLET | ORAL | 1 refills | Status: DC

## 2018-04-20 NOTE — Progress Notes (Signed)
Patient ID: [name], female   DOB: [birth year], 37 y.o.   MRN: [id number] History of Present Illness:  [name] is a 37 year old black female, married, in for well woman gyn exam,she had normal pap with negative HPV 02/10/16. She works third shift. PCP is Lubertha SouthSteve Luking.   Current Medications, Allergies, Past Medical History, Past Surgical History, Family History and Social History were reviewed in [facility] electronic medical record.     Review of Systems: Patient denies any headaches, hearing loss, fatigue, blurred vision, shortness of breath, chest pain, abdominal pain, problems with bowel movements, urination, or intercourse. No joint pain or mood swings. Has period then spots for a week, brown, no pain. She is sp tubal and ablation.    Physical Exam:BP 140/90 (BP Location: Left Arm, Cuff Size: Large)   Pulse 60   Ht 5\' 5"  (1.651 m)   Wt 184 lb (83.5 kg)   LMP 04/10/2018   BMI 30.62 kg/m  General:  Well developed, well nourished, no acute distress Skin:  Warm and dry Neck:  Midline trachea, normal thyroid, good ROM, no lymphadenopathy Lungs; Clear to auscultation bilaterally Breast:  No dominant palpable mass, retraction, or nipple discharge Cardiovascular: Regular rate and rhythm Abdomen:  Soft, non tender, no hepatosplenomegaly Pelvic:  External genitalia is normal in appearance, no lesions.  The vagina is normal in appearance,brown blood, no odor. Urethra has no lesions or masses. The cervix is bulbous.  Uterus is felt to be normal size, shape, and contour.  No adnexal masses or tenderness noted.Bladder is non tender, no masses felt. Extremities/musculoskeletal:  No swelling or varicosities noted, no clubbing or cyanosis Psych:  No mood changes, alert and cooperative,seems happy PHQ 2 score 0. Fall risk is low Examination chaperoned by Malachy MoodJanet Young LPN. Has not used OTC cold meds, but did work last night, will recheck BP in 4 weeks, try to rest.  Impression: 1.  Encounter for well woman exam with routine gynecological exam   2. Irregular intermenstrual bleeding   3. Elevated BP without diagnosis of hypertension       Plan: Will try megace to stop bleeding  Meds ordered this encounter  Medications  . megestrol (MEGACE) 40 MG tablet    Sig: Take 2 po daily til bleeding stops then 1 daily    Dispense:  60 tablet    Refill:  1    Order Specific Question:   Supervising Provider    Answer:   Despina HiddenEURE, LUTHER H [[id number]]  Recheck BP in 4 weeks  Pap and physical in 1 year Mammogram at 40 Labs with PCP

## 2018-05-18 ENCOUNTER — Ambulatory Visit: Payer: BLUE CROSS/BLUE SHIELD | Admitting: Adult Health

## 2019-02-09 ENCOUNTER — Other Ambulatory Visit: Payer: Self-pay

## 2019-02-09 DIAGNOSIS — U071 COVID-19: Secondary | ICD-10-CM | POA: Diagnosis not present

## 2019-02-11 LAB — NOVEL CORONAVIRUS, NAA: SARS-CoV-2, NAA: NOT DETECTED

## 2019-02-14 ENCOUNTER — Other Ambulatory Visit: Payer: Self-pay

## 2019-02-14 ENCOUNTER — Ambulatory Visit (INDEPENDENT_AMBULATORY_CARE_PROVIDER_SITE_OTHER): Payer: BC Managed Care – PPO | Admitting: Adult Health

## 2019-02-14 ENCOUNTER — Encounter: Payer: Self-pay | Admitting: Adult Health

## 2019-02-14 ENCOUNTER — Other Ambulatory Visit (HOSPITAL_COMMUNITY)
Admission: RE | Admit: 2019-02-14 | Discharge: 2019-02-14 | Disposition: A | Discharge status: Home / Self Care | Payer: BC Managed Care – PPO | Source: Ambulatory Visit | Attending: Adult Health | Admitting: Adult Health

## 2019-02-14 VITALS — BP 130/82 | HR 74 | Ht 65.0 in | Wt 199.4 lb

## 2019-02-14 DIAGNOSIS — Z01419 Encounter for gynecological examination (general) (routine) without abnormal findings: Secondary | ICD-10-CM | POA: Diagnosis not present

## 2019-02-14 NOTE — Progress Notes (Signed)
Patient ID: [name], female   DOB: 11-16-1980, 38 y.o.   MRN: [id number] History of Present Illness: [name] is a 38 year old black female, married, G2P2, works third shift at [employer], in for a well woman gyn exam and pap.She had ablation and still ha a period.  PCP is Dr Mickie Hillier.   Current Medications, Allergies, Past Medical History, Past Surgical History, Family History and Social History were reviewed in [employer] record.     Review of Systems: Patient denies any daily headaches, hearing loss, fatigue, blurred vision, shortness of breath, chest pain, abdominal pain, problems with bowel movements, urination, or intercourse. No joint pain or mood swings.    Physical Exam:BP 130/82 (BP Location: Left Arm, Cuff Size: Normal)   Pulse 74   Ht 5\' 5"  (1.651 m)   Wt 199 lb 6.4 oz (90.4 kg)   LMP 01/19/2019   BMI 33.18 kg/m  General:  Well developed, well nourished, no acute distress Skin:  Warm and dry Neck:  Midline trachea, normal thyroid, good ROM, no lymphadenopathy Lungs; Clear to auscultation bilaterally Breast:  No dominant palpable mass, retraction, or nipple discharge Cardiovascular: Regular rate and rhythm Abdomen:  Soft, non tender, no hepatosplenomegaly Pelvic:  External genitalia is normal in appearance, no lesions.  The vagina is normal in appearance. Urethra has no lesions or masses. The cervix is bulbous.Pap with high risk HPV and 16/18 genotyping performed.  Uterus is felt to be normal size, shape, and contour.  No adnexal masses or tenderness noted.Bladder is non tender, no masses felt. Extremities/musculoskeletal:  No swelling or varicosities noted, no clubbing or cyanosis Psych:  No mood changes, alert and cooperative,seems happy Fall risk is low PHQ 9 score is 0 Examination chaperoned by Diona Fanti CMA  Impression and Plan:  1. Encounter for gynecological examination with Papanicolaou smear of cervix Pap with high risk HPV and  16/18 genotyping sent -mammogram at 40 -labs with PCP  -Physical in 1 year -pap in 3 if normal

## 2019-02-22 LAB — CYTOLOGY - PAP
Adequacy: ABSENT
Comment: NEGATIVE
Diagnosis: NEGATIVE
High risk HPV: NEGATIVE

## 2019-03-27 ENCOUNTER — Other Ambulatory Visit: Payer: Self-pay

## 2019-03-27 ENCOUNTER — Ambulatory Visit (INDEPENDENT_AMBULATORY_CARE_PROVIDER_SITE_OTHER): Payer: BC Managed Care – PPO | Admitting: Family Medicine

## 2019-03-27 ENCOUNTER — Encounter: Payer: Self-pay | Admitting: Family Medicine

## 2019-03-27 VITALS — BP 146/84 | Temp 97.4°F | Wt 187.8 lb

## 2019-03-27 DIAGNOSIS — R030 Elevated blood-pressure reading, without diagnosis of hypertension: Secondary | ICD-10-CM | POA: Diagnosis not present

## 2019-03-27 NOTE — Patient Instructions (Signed)

## 2019-03-27 NOTE — Progress Notes (Signed)
   Subjective:    Patient ID: [name], female    DOB: 05-08-1981, 38 y.o.   MRN: [id number]  Hypertension This is a new problem. Associated symptoms include headaches. (Light headed ) Risk factors for coronary artery disease include family history.   Pt has had high reading at [facility] and when she took her mom to doctor, her moms doctor checked BP and it was high.   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  No cough no shortness of breath     Objective:   Physical Exam Alert vitals stable, NAD. Blood pressure 132/8688 on repeat similar both arms.  HEENT normal. Lungs clear. Heart regular rate and rhythm.        Assessment & Plan:  Impression mild elevation of blood pressure.  Not enough to warrant medications at this time.  Discussed.  Rare transient dizziness does not sound related to blood pressure.  Also intermittent headaches likely stress-induced.  Discussed.  Symptom care discussed.  Education information given.  Will reassess at least yearly for potential for medicines

## 2019-04-01 ENCOUNTER — Other Ambulatory Visit: Payer: Self-pay

## 2019-04-01 ENCOUNTER — Emergency Department (HOSPITAL_COMMUNITY)
Admission: EM | Admit: 2019-04-01 | Discharge: 2019-04-01 | Disposition: A | Discharge status: Home / Self Care | Payer: BC Managed Care – PPO | Attending: Emergency Medicine | Admitting: Emergency Medicine

## 2019-04-01 ENCOUNTER — Encounter (HOSPITAL_COMMUNITY): Payer: Self-pay | Admitting: Emergency Medicine

## 2019-04-01 DIAGNOSIS — R1030 Lower abdominal pain, unspecified: Secondary | ICD-10-CM | POA: Diagnosis not present

## 2019-04-01 DIAGNOSIS — N3001 Acute cystitis with hematuria: Secondary | ICD-10-CM

## 2019-04-01 DIAGNOSIS — N3091 Cystitis, unspecified with hematuria: Secondary | ICD-10-CM | POA: Diagnosis not present

## 2019-04-01 DIAGNOSIS — Z87891 Personal history of nicotine dependence: Secondary | ICD-10-CM | POA: Insufficient documentation

## 2019-04-01 LAB — URINALYSIS, ROUTINE W REFLEX MICROSCOPIC
Bilirubin Urine: NEGATIVE
Glucose, UA: NEGATIVE mg/dL
Ketones, ur: 80 mg/dL — AB
Nitrite: NEGATIVE
Protein, ur: NEGATIVE mg/dL
Specific Gravity, Urine: 1.027 (ref 1.005–1.030)
pH: 6 (ref 5.0–8.0)

## 2019-04-01 LAB — POC URINE PREG, ED: Preg Test, Ur: NEGATIVE

## 2019-04-01 MED ORDER — CEPHALEXIN 500 MG PO CAPS: 500.0000 mg | ORAL_CAPSULE | Freq: Two times a day (BID) | ORAL | 0 refills | Status: DC

## 2019-04-01 MED ORDER — PHENAZOPYRIDINE HCL 200 MG PO TABS: 200.0000 mg | ORAL_TABLET | Freq: Three times a day (TID) | ORAL | 0 refills | Status: DC

## 2019-04-01 NOTE — Discharge Instructions (Signed)
Take the antibiotic prescribed until gone.  You may use the pyridium prescribed which can help with the pressure and discomfort while this infection is resolving.  Make sure you are drinking plenty of fluids.  Pyridium will turn your urine bright yellow/orange and is normal.  Get rechecked for persistent or worsened symptoms, especially if you develop fevers, nausea or worsened current symptoms.

## 2019-04-01 NOTE — ED Triage Notes (Signed)
C/o cloudy urine, not burning or odor.  C/o bladder and back pain, rating pain 6/10.

## 2019-04-01 NOTE — ED Provider Notes (Signed)
[facility] EMERGENCY DEPARTMENT Provider Note   CSN: [id number] Arrival date & time: 04/01/19  7048     History   Chief Complaint Chief Complaint  Patient presents with  . Recurrent UTI    HPI [name] is a 38 y.o. female with a history of menorrhagia, improved after ablation surgery and infrequent uti's presenting with a 4-5 day history of suprapubic pressure discomfort with radiation into her central lower back in association with urinary frequency, fluidy and pink tinged urine.  She denies fevers, chills, flank pain and has no n/v, vaginal dc.  She has noted some constipation since starting Keto diet 2 weeks ago.  She has had no tx prior to arrival.  No concern for std's, denies vaginal dc.      The history is provided by the patient.    Past Medical History:  Diagnosis Date  . BV (bacterial vaginosis) 12/30/2014  . Heart murmur   . Herpes simplex virus (HSV) infection   . Labial irritation 02/05/2015  . Menorrhagia 03/06/2013  . Menstrual cramps 02/05/2015  . Positive test for herpes simplex virus (HSV) antibody 12/31/2014  . Vaginal irritation 12/30/2014    Patient Active Problem List   Diagnosis Date Noted  . Irregular intermenstrual bleeding 04/20/2018  . Encounter for well woman exam with routine gynecological exam 04/20/2018  . Vitamin D deficiency 07/04/2015  . Other specified abdominal hernia without obstruction or gangrene 07/03/2015  . Varicose veins of lower extremities with ulcer ([place]) 07/03/2015  . Labial irritation 02/05/2015  . Menstrual cramps 02/05/2015  . Positive test for herpes simplex virus (HSV) antibody 12/31/2014  . Vaginal irritation 12/30/2014  . BV (bacterial vaginosis) 12/30/2014  . Excessive or frequent menstruation 03/23/2013  . Dysmenorrhea 03/23/2013  . Menorrhagia 03/06/2013  . Abdominal muscle strain 03/06/2012  . Diastasis recti 03/06/2012  . Tobacco abuse 03/06/2012  . TONSILLITIS 05/15/2008  . AMENORRHEA 11/30/2006  .  SYMPTOM, HEADACHE 11/30/2006  . PELVIC  PAIN 11/30/2006  . ELEVATED BLOOD PRESSURE 11/30/2006  . FIBROCYSTIC BREAST DISEASE 05/14/2006    Past Surgical History:  Procedure Laterality Date  . CESAREAN SECTION     x2,2006,2011  . DILITATION & CURRETTAGE/HYSTROSCOPY WITH THERMACHOICE ABLATION N/A 04/11/2013   Procedure: DILATATION & CURETTAGE/HYSTEROSCOPY WITH THERMACHOICE ABLATION;  Surgeon: Florian Buff, MD;  Location: [facility];  Service: Gynecology;  Laterality: N/A;  Total Ablation Therapy Time = 9 minutes 38 seconds; 86-88 deg C  . HERNIA REPAIR  2012  . LAPAROSCOPIC INCISIONAL / UMBILICAL / VENTRAL HERNIA REPAIR  09/2010   Dr. Arnoldo Morale  . TUBAL LIGATION  2011     OB History    Gravida  2   Para  2   Term      Preterm      AB      Living  2     SAB      TAB      Ectopic      Multiple      Live Births  2            Home Medications    Prior to Admission medications   Medication Sig Start Date End Date Taking? Authorizing Provider  cephALEXin (KEFLEX) 500 MG capsule Take 1 capsule (500 mg total) by mouth 2 (two) times daily. 04/01/19   Evalee Jefferson, PA-C  phenazopyridine (PYRIDIUM) 200 MG tablet Take 1 tablet (200 mg total) by mouth 3 (three) times daily. 04/01/19   Evalee Jefferson, PA-C  Family History Family History  Problem Relation Age of Onset  . Cancer Paternal Uncle        unknown  . Cancer Paternal Grandmother        unknown  . Diabetes Maternal Grandfather   . Alcohol abuse Paternal Grandfather     Social History Social History   Tobacco Use  . Smoking status: Former Smoker    Packs/day: 0.25    Years: 5.00    Pack years: 1.25    Types: Cigarettes    Quit date: 02/25/2018    Years since quitting: 1.0  . Smokeless tobacco: Never Used  Substance Use Topics  . Alcohol use: Yes    Alcohol/week: 0.0 standard drinks    Comment: occ  . Drug use: No     Allergies   Patient has no known allergies.   Review of Systems Review of  Systems  Constitutional: Negative for chills and fever.  HENT: Negative for congestion and sore throat.   Eyes: Negative.   Respiratory: Negative for chest tightness and shortness of breath.   Cardiovascular: Negative for chest pain.  Gastrointestinal: Negative for abdominal pain and nausea.  Genitourinary: Positive for frequency and urgency. Negative for dysuria, menstrual problem, vaginal bleeding and vaginal discharge.  Musculoskeletal: Positive for back pain. Negative for arthralgias, joint swelling and neck pain.  Skin: Negative.  Negative for rash and wound.  Neurological: Negative for dizziness, weakness, light-headedness, numbness and headaches.  Psychiatric/Behavioral: Negative.      Physical Exam Updated Vital Signs BP (!) 144/88 (BP Location: Right Arm)   Temp 98.3 F (36.8 C) (Oral)   Resp 16   Ht 5\' 5"  (1.651 m)   Wt 83.5 kg   LMP 03/16/2019 (Exact Date)   SpO2 100%   BMI 30.62 kg/m   Physical Exam Vitals signs and nursing note reviewed.  Constitutional:      Appearance: She is well-developed.  HENT:     Head: Normocephalic and atraumatic.  Eyes:     Conjunctiva/sclera: Conjunctivae normal.  Neck:     Musculoskeletal: Normal range of motion.  Cardiovascular:     Rate and Rhythm: Normal rate and regular rhythm.     Heart sounds: Normal heart sounds.  Pulmonary:     Effort: Pulmonary effort is normal.     Breath sounds: Normal breath sounds. No wheezing.  Abdominal:     General: Bowel sounds are normal. There is no distension.     Palpations: Abdomen is soft.     Tenderness: There is abdominal tenderness in the suprapubic area. There is no right CVA tenderness, left CVA tenderness or guarding.     Comments: Mild pressure sensation to suprapubic pressure, no acute abd findings.  Musculoskeletal: Normal range of motion.  Skin:    General: Skin is warm and dry.  Neurological:     Mental Status: She is alert.      ED Treatments / Results  Labs (all  labs ordered are listed, but only abnormal results are displayed) Labs Reviewed  URINALYSIS, ROUTINE W REFLEX MICROSCOPIC - Abnormal; Notable for the following components:      Result Value   Hgb urine dipstick SMALL (*)    Ketones, ur 80 (*)    Leukocytes,Ua LARGE (*)    Bacteria, UA RARE (*)    All other components within normal limits  URINE CULTURE  POC URINE PREG, ED    EKG None  Radiology No results found.  Procedures Procedures (including critical care time)  Medications Ordered in ED Medications - No data to display   Initial Impression / Assessment and Plan / ED Course  I have reviewed the triage vital signs and the nursing notes.  Pertinent labs & imaging results that were available during my care of the patient were reviewed by me and considered in my medical decision making (see chart for details).        Labs reviewed and discussed with pt. Urine cx sent given equivocal results, but will tx given sx.  No vaginal dc, denies std risks. She does not wear contacts so pyridium is prescribed.  Recheck precautions outlined. Currently on keto diet which explains increased ketones in UA.   Final Clinical Impressions(s) / ED Diagnoses   Final diagnoses:  Acute cystitis with hematuria    ED Discharge Orders         Ordered    phenazopyridine (PYRIDIUM) 200 MG tablet  3 times daily     04/01/19 1015    cephALEXin (KEFLEX) 500 MG capsule  2 times daily     04/01/19 1015           Burgess Amordol, Iman Reinertsen, Cordelia Poche-C 04/01/19 1021    Linwood DibblesKnapp, Jon, MD 04/01/19 1515

## 2019-04-03 LAB — URINE CULTURE: Special Requests: NORMAL

## 2019-04-10 ENCOUNTER — Ambulatory Visit: Payer: BC Managed Care – PPO | Admitting: Adult Health

## 2019-04-10 ENCOUNTER — Other Ambulatory Visit: Payer: Self-pay

## 2019-04-10 ENCOUNTER — Encounter: Payer: Self-pay | Admitting: Adult Health

## 2019-04-10 VITALS — BP 120/81 | HR 92 | Ht 65.0 in | Wt 182.6 lb

## 2019-04-10 DIAGNOSIS — Z87440 Personal history of urinary (tract) infections: Secondary | ICD-10-CM | POA: Diagnosis not present

## 2019-04-10 DIAGNOSIS — M545 Low back pain, unspecified: Secondary | ICD-10-CM

## 2019-04-10 LAB — POCT URINALYSIS DIPSTICK OB
Glucose, UA: NEGATIVE
Leukocytes, UA: NEGATIVE
Nitrite, UA: NEGATIVE
POC,PROTEIN,UA: NEGATIVE

## 2019-04-10 NOTE — Progress Notes (Signed)
  Subjective:     Patient ID: [name], female   DOB: 1981/03/27, 38 y.o.   MRN: [id number]  HPI [name] is a 38 year old black female, G2P2 in for ER follow up, was seen [facility] ER 11/22 and treated for UTI with keflex, has low back pain and vaginal discharge, was pinkish in color. PCP is Mickie Hillier.   Review of Systems Has low back pain that comes and goes +vaginal discharge Denies any odor or burning  +recent UTI  Reviewed past medical,surgical, social and family history. Reviewed medications and allergies.     Objective:   Physical Exam BP 120/81 (BP Location: Left Arm, Patient Position: Sitting, Cuff Size: Normal)   Pulse 92   Ht 5\' 5"  (1.651 m)   Wt 182 lb 9.6 oz (82.8 kg)   LMP 03/16/2019 (Exact Date)   BMI 30.39 kg/m   Urine dipstick trace blood, large ketones NO CVAT, has low back pain that comes and goes  Skin warm and dry.Pelvic: external genitalia is normal in appearance no lesions, vagina: creamy discharge without odor,urethra has no lesions or masses noted, cervix:smooth and bulbous, uterus: normal size, shape and contour, non tender, no masses felt, adnexa: no masses or tenderness noted. Bladder is non tender and no masses felt   Examination chaperoned by Rolena Infante LPN.  Assessment:     1. Low back pain without sciatica, unspecified back pain laterality, unspecified chronicity   2. History of UTI       Plan:     UA C&S sent Push fluids  Declines any meds for back Follow up prn

## 2019-04-11 LAB — URINALYSIS, ROUTINE W REFLEX MICROSCOPIC
Bilirubin, UA: NEGATIVE
Glucose, UA: NEGATIVE
Leukocytes,UA: NEGATIVE
Nitrite, UA: NEGATIVE
Protein,UA: NEGATIVE
RBC, UA: NEGATIVE
Specific Gravity, UA: >=1.03 — AB (ref 1.005–1.030)
Urobilinogen, Ur: 1 mg/dL (ref 0.2–1.0)
pH, UA: 6 (ref 5.0–7.5)

## 2019-04-12 LAB — URINE CULTURE

## 2019-04-25 ENCOUNTER — Other Ambulatory Visit (INDEPENDENT_AMBULATORY_CARE_PROVIDER_SITE_OTHER): Payer: BC Managed Care – PPO | Admitting: *Deleted

## 2019-04-25 ENCOUNTER — Other Ambulatory Visit (HOSPITAL_COMMUNITY)
Admission: RE | Admit: 2019-04-25 | Discharge: 2019-04-25 | Disposition: A | Discharge status: Home / Self Care | Payer: BC Managed Care – PPO | Source: Ambulatory Visit | Attending: Obstetrics & Gynecology | Admitting: Obstetrics & Gynecology

## 2019-04-25 ENCOUNTER — Other Ambulatory Visit: Payer: Self-pay

## 2019-04-25 DIAGNOSIS — R300 Dysuria: Secondary | ICD-10-CM | POA: Insufficient documentation

## 2019-04-25 LAB — POCT URINALYSIS DIPSTICK OB
Blood, UA: NEGATIVE
Glucose, UA: NEGATIVE
Nitrite, UA: NEGATIVE
POC,PROTEIN,UA: NEGATIVE

## 2019-04-25 NOTE — Progress Notes (Signed)
Chart reviewed for nurse visit. Agree with plan of care.  Estill Dooms, NP 04/25/2019 12:15 PM

## 2019-04-25 NOTE — Progress Notes (Signed)
   NURSE VISIT- UTI SYMPTOMS   SUBJECTIVE:  [name] is a 38 y.o. G2P2 female here for UTI symptoms. She is a GYN patient. She reports dysuria.  OBJECTIVE:  There were no vitals taken for this visit.  Appears well, in no apparent distress  Results for orders placed or performed in visit on 04/25/19 (from the past 24 hour(s))  POC Urinalysis Dipstick OB   Collection Time: 04/25/19 10:30 AM  Result Value Ref Range   Color, UA     Clarity, UA     Glucose, UA Negative Negative   Bilirubin, UA     Ketones, UA large    Spec Grav, UA     Blood, UA neg    pH, UA     POC,PROTEIN,UA Negative Negative, Trace, Small (1+), Moderate (2+), Large (3+), 4+   Urobilinogen, UA     Nitrite, UA neg    Leukocytes, UA Small (1+) (A) Negative   Appearance     Odor      ASSESSMENT: GYN patient with UTI symptoms and negative nitrites  PLAN: Note routed to Derrek Monaco, AGNP   Rx sent by provider today: No Urine culture sent Call or return to clinic prn if these symptoms worsen or fail to improve as anticipated. Follow-up: as needed   Zaelyn Noack, Celene Squibb  04/25/2019 10:31 AM  Swab also collected for gc/ch and trich.

## 2019-04-26 LAB — CERVICOVAGINAL ANCILLARY ONLY
Chlamydia: NEGATIVE
Comment: NEGATIVE
Comment: NEGATIVE
Comment: NORMAL
Neisseria Gonorrhea: NEGATIVE
Trichomonas: NEGATIVE

## 2019-04-26 LAB — URINALYSIS, ROUTINE W REFLEX MICROSCOPIC
Bilirubin, UA: NEGATIVE
Glucose, UA: NEGATIVE
Leukocytes,UA: NEGATIVE
Nitrite, UA: NEGATIVE
Protein,UA: NEGATIVE
RBC, UA: NEGATIVE
Specific Gravity, UA: 1.011 (ref 1.005–1.030)
Urobilinogen, Ur: 0.2 mg/dL (ref 0.2–1.0)
pH, UA: 7 (ref 5.0–7.5)

## 2019-04-27 LAB — URINE CULTURE

## 2019-04-30 ENCOUNTER — Other Ambulatory Visit: Payer: Self-pay | Admitting: Adult Health

## 2019-04-30 MED ORDER — SULFAMETHOXAZOLE-TRIMETHOPRIM 800-160 MG PO TABS: 1.0000 | ORAL_TABLET | Freq: Two times a day (BID) | ORAL | 0 refills | Status: DC

## 2019-04-30 NOTE — Progress Notes (Signed)
rx septra ds 

## 2019-06-11 ENCOUNTER — Encounter: Payer: Self-pay | Admitting: Family Medicine

## 2019-09-18 ENCOUNTER — Ambulatory Visit
Admission: EM | Admit: 2019-09-18 | Discharge: 2019-09-18 | Disposition: A | Discharge status: Home / Self Care | Payer: BC Managed Care – PPO | Attending: Emergency Medicine | Admitting: Emergency Medicine

## 2019-09-18 ENCOUNTER — Other Ambulatory Visit: Payer: Self-pay

## 2019-09-18 DIAGNOSIS — M5441 Lumbago with sciatica, right side: Secondary | ICD-10-CM

## 2019-09-18 MED ORDER — CYCLOBENZAPRINE HCL 10 MG PO TABS: 10.0000 mg | ORAL_TABLET | Freq: Every day | ORAL | 0 refills | Status: DC

## 2019-09-18 MED ORDER — PREDNISONE 20 MG PO TABS: 20.0000 mg | ORAL_TABLET | Freq: Two times a day (BID) | ORAL | 0 refills | Status: AC

## 2019-09-18 NOTE — ED Provider Notes (Signed)
[place]   [id number] 09/18/19 Arrival Time: 7628  CC: Back PAIN  SUBJECTIVE: History from: patient. [name] is a 39 y.o. female complains of back pain x 9 days.  Denies a precipitating event or specific injury.  Does admit to working out and exercising prior to symptoms.  Localizes the pain to the RT low back.  Describes the pain as intermittent and sharp in character.  Has tried OTC medications without relief.  Denies aggravating factors.  Reports similar symptoms in the past.  Denies fever, chills, erythema, ecchymosis, effusion, weakness, numbness and tingling, saddle paresthesias, loss of bowel or bladder function.      ROS: As per HPI.  All other pertinent ROS negative.     Past Medical History:  Diagnosis Date  . BV (bacterial vaginosis) 12/30/2014  . Heart murmur   . Herpes simplex virus (HSV) infection   . Labial irritation 02/05/2015  . Menorrhagia 03/06/2013  . Menstrual cramps 02/05/2015  . Positive test for herpes simplex virus (HSV) antibody 12/31/2014  . Vaginal irritation 12/30/2014   Past Surgical History:  Procedure Laterality Date  . CESAREAN SECTION     x2,2006,2011  . DILITATION & CURRETTAGE/HYSTROSCOPY WITH THERMACHOICE ABLATION N/A 04/11/2013   Procedure: DILATATION & CURETTAGE/HYSTEROSCOPY WITH THERMACHOICE ABLATION;  Surgeon: Florian Buff, MD;  Location: [facility];  Service: Gynecology;  Laterality: N/A;  Total Ablation Therapy Time = 9 minutes 38 seconds; 86-88 deg C  . HERNIA REPAIR  2012  . LAPAROSCOPIC INCISIONAL / UMBILICAL / VENTRAL HERNIA REPAIR  09/2010   Dr. Arnoldo Morale  . TUBAL LIGATION  2011   No Known Allergies No current facility-administered medications on file prior to encounter.   No current outpatient medications on file prior to encounter.   Social History   Socioeconomic History  . Marital status: Married    Spouse name: Legrand Como  . Number of children: 12  . Years of education: 33  . Highest education level: Not on file   Occupational History    Comment: [employer]  Tobacco Use  . Smoking status: Former Smoker    Packs/day: 0.25    Years: 5.00    Pack years: 1.25    Types: Cigarettes    Quit date: 02/25/2018    Years since quitting: 1.5  . Smokeless tobacco: Never Used  Substance and Sexual Activity  . Alcohol use: Yes    Alcohol/week: 0.0 standard drinks    Comment: occ  . Drug use: No  . Sexual activity: Yes    Birth control/protection: Surgical    Comment: tubal with ablation  Other Topics Concern  . Not on file  Social History Narrative  . Not on file   Social Determinants of Health   Financial Resource Strain:   . Difficulty of Paying Living Expenses:   Food Insecurity:   . Worried About Charity fundraiser in the Last Year:   . Arboriculturist in the Last Year:   Transportation Needs:   . Film/video editor (Medical):   Marland Kitchen Lack of Transportation (Non-Medical):   Physical Activity:   . Days of Exercise per Week:   . Minutes of Exercise per Session:   Stress:   . Feeling of Stress :   Social Connections:   . Frequency of Communication with Friends and Family:   . Frequency of Social Gatherings with Friends and Family:   . Attends Religious Services:   . Active Member of Clubs or Organizations:   . Attends  Club or Organization Meetings:   Marland Kitchen Marital Status:   Intimate Partner Violence:   . Fear of Current or Ex-Partner:   . Emotionally Abused:   Marland Kitchen Physically Abused:   . Sexually Abused:    Family History  Problem Relation Age of Onset  . Cancer Paternal Uncle        unknown  . Cancer Paternal Grandmother        unknown  . Diabetes Maternal Grandfather   . Alcohol abuse Paternal Grandfather     OBJECTIVE:  Vitals:   09/18/19 0902  BP: 136/85  Pulse: 68  Resp: 16  Temp: 98.1 F (36.7 C)  TempSrc: Oral  SpO2: 99%    General appearance: ALERT; in no acute distress.  Head: NCAT Lungs: Normal respiratory effort; CTAB CV: RRR Musculoskeletal: Back  Inspection:  Skin warm, dry, clear and intact without obvious erythema, effusion, or ecchymosis.  Palpation: TTP over RT low back ROM: FROM active and passive Strength: 5/5 shld abduction, 5/5 shld adduction, 5/5 elbow flexion, 5/5 elbow extension, 5/5 grip strength, 5/5 hip flexion, 5/5 hip extension Skin: warm and dry Neurologic: Ambulates without difficulty; Sensation intact about the upper/ lower extremities Psychological: alert and cooperative; normal mood and affect  ASSESSMENT & PLAN:  1. Acute right-sided low back pain with right-sided sciatica     Meds ordered this encounter  Medications  . predniSONE (DELTASONE) 20 MG tablet    Sig: Take 1 tablet (20 mg total) by mouth 2 (two) times daily with a meal for 5 days.    Dispense:  10 tablet    Refill:  0    Order Specific Question:   Supervising Provider    Answer:   Eustace Moore [[id number]]  . cyclobenzaprine (FLEXERIL) 10 MG tablet    Sig: Take 1 tablet (10 mg total) by mouth at bedtime.    Dispense:  15 tablet    Refill:  0    Order Specific Question:   Supervising Provider    Answer:   Eustace Moore [[id number]]    Continue conservative management of rest, ice, and gentle stretches Prednisone prescribed.  Take as directed and to completion Take cyclobenzaprine at nighttime for symptomatic relief. Avoid driving or operating heavy machinery while using medication. Follow up with PCP if symptoms persist Return or go to the ER if you have any new or worsening symptoms (fever, chills, chest pain, abdominal pain, changes in bowel or bladder habits, pain radiating into lower legs, etc...)    Reviewed expectations re: course of current medical issues. Questions answered. Outlined signs and symptoms indicating need for more acute intervention. Patient verbalized understanding. After Visit Summary given.    Rennis Harding, PA-C 09/18/19 [phone]

## 2019-09-18 NOTE — ED Triage Notes (Signed)
Pt presents with c/o low back pain, denies injury , needs work note

## 2019-09-18 NOTE — Discharge Instructions (Signed)
Continue conservative management of rest, ice, and gentle stretches Prednisone prescribed.  Take as directed and to completion Take cyclobenzaprine at nighttime for symptomatic relief. Avoid driving or operating heavy machinery while using medication. Follow up with PCP if symptoms persist Return or go to the ER if you have any new or worsening symptoms (fever, chills, chest pain, abdominal pain, changes in bowel or bladder habits, pain radiating into lower legs, etc...)  

## 2019-10-09 ENCOUNTER — Encounter: Payer: Self-pay | Admitting: Adult Health

## 2019-10-09 ENCOUNTER — Ambulatory Visit: Payer: BC Managed Care – PPO | Admitting: Adult Health

## 2019-10-09 VITALS — BP 134/86 | HR 76 | Ht 65.0 in | Wt 197.5 lb

## 2019-10-09 DIAGNOSIS — N6001 Solitary cyst of right breast: Secondary | ICD-10-CM | POA: Diagnosis not present

## 2019-10-09 MED ORDER — SULFAMETHOXAZOLE-TRIMETHOPRIM 800-160 MG PO TABS: 1.0000 | ORAL_TABLET | Freq: Two times a day (BID) | ORAL | 0 refills | Status: DC

## 2019-10-09 NOTE — Progress Notes (Signed)
  Subjective:     Patient ID: [name], female   DOB: [birth year], 39 y.o.   MRN: [id number]  HPI [name] is a 39 year old black female, married, G2P2 in complaining of pain in right breast for about 5 days, and ?boil near nipple. PCP is Dr Lubertha South  Review of Systems Has pain in right breast for about 5 days and ?boil near nipple She denies any injury, no fever  No history of any bug bites Reviewed past medical,surgical, social and family history. Reviewed medications and allergies.     Objective:   Physical Exam BP 134/86 (BP Location: Left Arm, Patient Position: Sitting, Cuff Size: Normal)   Pulse 76   Ht 5\' 5"  (1.651 m)   Wt 197 lb 8 oz (89.6 kg)   BMI 32.87 kg/m  Skin warm and dry. Neck: mid line trachea, normal thyroid, good ROM, no lymphadenopathy noted. Lungs: clear to ausculation bilaterally. Cardiovascular: regular rate and rhythm.  Breasts:no dominate palpable mass, retraction or nipple discharge,on the right has more prominent montgomery glands and one at 9 0' clock is looking raw and irritated. Co exam with NP student.     Assessment:     1. Montgomery gland cyst of right breast Will rx septra ds Meds ordered this encounter  Medications  . sulfamethoxazole-trimethoprim (BACTRIM DS) 800-160 MG tablet    Sig: Take 1 tablet by mouth 2 (two) times daily. Take 1 bid    Dispense:  28 tablet    Refill:  0    Order Specific Question:   Supervising Provider    Answer:   Richelle Ito [[id number]]      Plan:     Recheck in 2 weeks for resolution, if still present, may get Lazaro Arms

## 2019-10-23 ENCOUNTER — Ambulatory Visit: Payer: BC Managed Care – PPO | Admitting: Adult Health

## 2019-10-23 ENCOUNTER — Encounter: Payer: Self-pay | Admitting: Adult Health

## 2019-10-23 VITALS — BP 133/87 | HR 74 | Ht 65.0 in | Wt 194.6 lb

## 2019-10-23 DIAGNOSIS — N6001 Solitary cyst of right breast: Secondary | ICD-10-CM

## 2019-10-23 NOTE — Progress Notes (Signed)
  Subjective:     Patient ID: Rainy Rothman, female   DOB: [birth year], 39 y.o.   MRN: [id number]  HPI Quamesha is a 39 year old black female, married, G2P2 back in follow up on taking septra ds for pain in right breast with irritated  Montgomery gland cyst and she has no pain now. PCP is Dr Lubertha South.  Review of Systems No pain in right breast and area has resolved. Reviewed past medical,surgical, social and family history. Reviewed medications and allergies.     Objective:   Physical Exam BP 133/87 (BP Location: Right Arm, Patient Position: Sitting, Cuff Size: Normal)   Pulse 74   Ht 5\' 5"  (1.651 m)   Wt 194 lb 9.6 oz (88.3 kg)   BMI 32.38 kg/m  Skin warm and dry,  Breasts:no dominate palpable mass, retraction or nipple discharge,the montgomery gland at 9 0' clock is no longer raw and irritated     Assessment:     1. Montgomery gland cyst of right breast, resolved    Plan:    Get mammogram at 40 Follow up prn

## 2019-11-22 ENCOUNTER — Other Ambulatory Visit: Payer: Self-pay

## 2019-11-22 ENCOUNTER — Encounter: Payer: Self-pay | Admitting: Emergency Medicine

## 2019-11-22 ENCOUNTER — Ambulatory Visit
Admission: EM | Admit: 2019-11-22 | Discharge: 2019-11-22 | Disposition: A | Discharge status: Home / Self Care | Payer: BC Managed Care – PPO | Attending: Emergency Medicine | Admitting: Emergency Medicine

## 2019-11-22 DIAGNOSIS — Z20822 Contact with and (suspected) exposure to covid-19: Secondary | ICD-10-CM

## 2019-11-22 DIAGNOSIS — R059 Cough, unspecified: Secondary | ICD-10-CM

## 2019-11-22 MED ORDER — BENZONATATE 100 MG PO CAPS: 100.0000 mg | ORAL_CAPSULE | Freq: Three times a day (TID) | ORAL | 0 refills | Status: DC

## 2019-11-22 NOTE — ED Provider Notes (Signed)
[facility]   [id number] 11/22/19 Arrival Time: 1017   CC: COVID symptoms  SUBJECTIVE: History from: patient.  [name] is a 39 y.o. female who presents with dry cough x 2 days.  COVID exposure 3 days ago.  Admits to recent travel.  Has tried airborne without relief. Denies past hx of COVID infection.  Has not received COVID vaccine.  Denies fever, chills, fatigue, sinus pain, rhinorrhea, sore throat, SOB, wheezing, chest pain, nausea, changes in bowel or bladder habits.    ROS: As per HPI.  All other pertinent ROS negative.     Past Medical History:  Diagnosis Date  . BV (bacterial vaginosis) 12/30/2014  . Heart murmur   . Herpes simplex virus (HSV) infection   . Labial irritation 02/05/2015  . Menorrhagia 03/06/2013  . Menstrual cramps 02/05/2015  . Positive test for herpes simplex virus (HSV) antibody 12/31/2014  . Vaginal irritation 12/30/2014   Past Surgical History:  Procedure Laterality Date  . CESAREAN SECTION     x2,2006,2011  . DILITATION & CURRETTAGE/HYSTROSCOPY WITH THERMACHOICE ABLATION N/A 04/11/2013   Procedure: DILATATION & CURETTAGE/HYSTEROSCOPY WITH THERMACHOICE ABLATION;  Surgeon: Lazaro Arms, MD;  Location: [facility];  Service: Gynecology;  Laterality: N/A;  Total Ablation Therapy Time = 9 minutes 38 seconds; 86-88 deg C  . HERNIA REPAIR  2012  . LAPAROSCOPIC INCISIONAL / UMBILICAL / VENTRAL HERNIA REPAIR  09/2010   Dr. Lovell Sheehan  . TUBAL LIGATION  2011   No Known Allergies No current facility-administered medications on file prior to encounter.   No current outpatient medications on file prior to encounter.   Social History   Socioeconomic History  . Marital status: Married    Spouse name: Casimiro Needle  . Number of children: 12  . Years of education: 41  . Highest education level: Not on file  Occupational History    Comment: [employer]  Tobacco Use  . Smoking status: Former Smoker    Packs/day: 0.25    Years: 5.00    Pack years: 1.25     Types: Cigarettes    Quit date: 02/25/2018    Years since quitting: 1.7  . Smokeless tobacco: Never Used  Vaping Use  . Vaping Use: Never used  Substance and Sexual Activity  . Alcohol use: Yes    Alcohol/week: 0.0 standard drinks    Comment: occ  . Drug use: No  . Sexual activity: Yes    Birth control/protection: Surgical    Comment: tubal with ablation  Other Topics Concern  . Not on file  Social History Narrative  . Not on file   Social Determinants of Health   Financial Resource Strain:   . Difficulty of Paying Living Expenses:   Food Insecurity:   . Worried About Programme researcher, broadcasting/film/video in the Last Year:   . Barista in the Last Year:   Transportation Needs:   . Freight forwarder (Medical):   Marland Kitchen Lack of Transportation (Non-Medical):   Physical Activity:   . Days of Exercise per Week:   . Minutes of Exercise per Session:   Stress:   . Feeling of Stress :   Social Connections:   . Frequency of Communication with Friends and Family:   . Frequency of Social Gatherings with Friends and Family:   . Attends Religious Services:   . Active Member of Clubs or Organizations:   . Attends Banker Meetings:   Marland Kitchen Marital Status:   Intimate Partner Violence:   .  Fear of Current or Ex-Partner:   . Emotionally Abused:   Marland Kitchen Physically Abused:   . Sexually Abused:    Family History  Problem Relation Age of Onset  . Cancer Paternal Uncle        unknown  . Cancer Paternal Grandmother        unknown  . Diabetes Maternal Grandfather   . Alcohol abuse Paternal Grandfather     OBJECTIVE:  Vitals:   11/22/19 0951  BP: (!) 148/92  Pulse: 82  Resp: 18  Temp: 99.1 F (37.3 C)  TempSrc: Oral  SpO2: 99%    General appearance: alert; well-appearing, nontoxic; speaking in full sentences and tolerating own secretions HEENT: NCAT; Ears: EACs clear, TMs pearly gray; Eyes: PERRL.  EOM grossly intact.Nose: nares patent without rhinorrhea, Throat: oropharynx  clear, tonsils non erythematous or enlarged, uvula midline  Neck: supple without LAD Lungs: unlabored respirations, symmetrical air entry; cough: absent; no respiratory distress; CTAB Heart: regular rate and rhythm.  Skin: warm and dry Psychological: alert and cooperative; normal mood and affect   ASSESSMENT & PLAN:  1. Exposure to COVID-19 virus   2. Suspected COVID-19 virus infection     Meds ordered this encounter  Medications  . benzonatate (TESSALON) 100 MG capsule    Sig: Take 1 capsule (100 mg total) by mouth every 8 (eight) hours.    Dispense:  21 capsule    Refill:  0    Order Specific Question:   Supervising Provider    Answer:   Eustace Moore [[id number]]    COVID testing ordered.  It will take between 2-5 days for test results.  Someone will contact you regarding abnormal results.    In the meantime: You should remain isolated in your home for 10 days from symptom onset AND greater than 72 hours after symptoms resolution (absence of fever without the use of fever-reducing medication and improvement in respiratory symptoms), whichever is longer Get plenty of rest and push fluids Tessalon Perles prescribed for cough Use OTC zyrtec for nasal congestion, runny nose, and/or sore throat Use OTC flonase for nasal congestion and runny nose Use medications daily for symptom relief Use OTC medications like ibuprofen or tylenol as needed fever or pain Call or go to the ED if you have any new or worsening symptoms such as fever, worsening cough, shortness of breath, chest tightness, chest pain, turning blue, changes in mental status, etc...   Reviewed expectations re: course of current medical issues. Questions answered. Outlined signs and symptoms indicating need for more acute intervention. Patient verbalized understanding. After Visit Summary given.         Rennis Harding, PA-C 11/22/19 [phone]

## 2019-11-22 NOTE — Discharge Instructions (Signed)

## 2019-11-22 NOTE — ED Triage Notes (Signed)
Exposure to covid x 3 , mild cough

## 2019-11-23 LAB — SARS-COV-2, NAA 2 DAY TAT

## 2019-11-23 LAB — NOVEL CORONAVIRUS, NAA: SARS-CoV-2, NAA: DETECTED — AB

## 2019-11-24 ENCOUNTER — Telehealth: Payer: Self-pay | Admitting: Adult Health

## 2019-11-24 NOTE — Telephone Encounter (Signed)
Called patient to review her covid positivity and eligibility to receive monoclonal antibody treatment as she is at increased risk for complications and or hospitalization from the virus due to her BMI and meets criteria for treatment.  She let me know that she is aware of the infusion, and does not want to receive it.    I thanked Paoli for her time and wished her a speedy recovery.  Lillard Anes, NP

## 2020-03-17 ENCOUNTER — Other Ambulatory Visit: Payer: BC Managed Care – PPO | Admitting: Adult Health

## 2020-03-26 ENCOUNTER — Other Ambulatory Visit: Payer: Self-pay

## 2020-03-26 ENCOUNTER — Encounter: Payer: Self-pay | Admitting: Emergency Medicine

## 2020-03-26 ENCOUNTER — Ambulatory Visit
Admission: EM | Admit: 2020-03-26 | Discharge: 2020-03-26 | Disposition: A | Discharge status: Home / Self Care | Payer: BC Managed Care – PPO | Attending: Emergency Medicine | Admitting: Emergency Medicine

## 2020-03-26 DIAGNOSIS — R030 Elevated blood-pressure reading, without diagnosis of hypertension: Secondary | ICD-10-CM

## 2020-03-26 DIAGNOSIS — R519 Headache, unspecified: Secondary | ICD-10-CM

## 2020-03-26 MED ORDER — KETOROLAC TROMETHAMINE 30 MG/ML IJ SOLN
30.0000 mg | Freq: Once | INTRAMUSCULAR | Status: AC
Start: 2020-03-26 — End: 2020-03-26
  Administered 2020-03-26: 30 mg via INTRAMUSCULAR

## 2020-03-26 MED ORDER — DEXAMETHASONE SODIUM PHOSPHATE 10 MG/ML IJ SOLN
10.0000 mg | Freq: Once | INTRAMUSCULAR | Status: AC
  Administered 2020-03-26: 10 mg via INTRAMUSCULAR

## 2020-03-26 MED ORDER — AMLODIPINE BESYLATE 5 MG PO TABS: 5.0000 mg | ORAL_TABLET | Freq: Every day | ORAL | 0 refills | Status: DC

## 2020-03-26 NOTE — ED Triage Notes (Signed)
Pt's bp has been running high for the past weeks. Has been running  150's over 100's. Pt reports she had a bad headache last night and was unable to work.

## 2020-03-26 NOTE — ED Provider Notes (Signed)
[facility]   [id number] 03/26/20 Arrival Time: 0901  EQ:ASTMHDQQ/ elevated blood pressure  SUBJECTIVE:  [name] is a 39 y.o. female who presents for headache and elevated blood pressure x 3 days.  Denies hx of HTN.  States blood pressure on average has been 150/100.    Reports HA and vision changes or light sensitivity. Localizes headache to front of forehead and describes as pounding/ throbbing in character.  Has tried OTC medications without relief.    Denies dizziness, lightheadedness, chest pain, shortness of breath, numbness or tingling in extremities, abdominal pain, changes in bowel or bladder habits.    ROS: As per HPI.  All other pertinent ROS negative.     Past Medical History:  Diagnosis Date  . BV (bacterial vaginosis) 12/30/2014  . Heart murmur   . Herpes simplex virus (HSV) infection   . Labial irritation 02/05/2015  . Menorrhagia 03/06/2013  . Menstrual cramps 02/05/2015  . Positive test for herpes simplex virus (HSV) antibody 12/31/2014  . Vaginal irritation 12/30/2014   Past Surgical History:  Procedure Laterality Date  . CESAREAN SECTION     x2,2006,2011  . DILITATION & CURRETTAGE/HYSTROSCOPY WITH THERMACHOICE ABLATION N/A 04/11/2013   Procedure: DILATATION & CURETTAGE/HYSTEROSCOPY WITH THERMACHOICE ABLATION;  Surgeon: Lazaro Arms, MD;  Location: [facility];  Service: Gynecology;  Laterality: N/A;  Total Ablation Therapy Time = 9 minutes 38 seconds; 86-88 deg C  . HERNIA REPAIR  2012  . LAPAROSCOPIC INCISIONAL / UMBILICAL / VENTRAL HERNIA REPAIR  09/2010   Dr. Lovell Sheehan  . TUBAL LIGATION  2011   No Known Allergies No current facility-administered medications on file prior to encounter.   No current outpatient medications on file prior to encounter.   Social History   Socioeconomic History  . Marital status: Married    Spouse name: Casimiro Needle  . Number of children: 12  . Years of education: 64  . Highest education level: Not on file    Occupational History    Comment: [employer]  Tobacco Use  . Smoking status: Former Smoker    Packs/day: 0.25    Years: 5.00    Pack years: 1.25    Types: Cigarettes    Quit date: 02/25/2018    Years since quitting: 2.0  . Smokeless tobacco: Never Used  Vaping Use  . Vaping Use: Never used  Substance and Sexual Activity  . Alcohol use: Yes    Alcohol/week: 0.0 standard drinks    Comment: occ  . Drug use: No  . Sexual activity: Yes    Birth control/protection: Surgical    Comment: tubal with ablation  Other Topics Concern  . Not on file  Social History Narrative  . Not on file   Social Determinants of Health   Financial Resource Strain:   . Difficulty of Paying Living Expenses: Not on file  Food Insecurity:   . Worried About Programme researcher, broadcasting/film/video in the Last Year: Not on file  . Ran Out of Food in the Last Year: Not on file  Transportation Needs:   . Lack of Transportation (Medical): Not on file  . Lack of Transportation (Non-Medical): Not on file  Physical Activity:   . Days of Exercise per Week: Not on file  . Minutes of Exercise per Session: Not on file  Stress:   . Feeling of Stress : Not on file  Social Connections:   . Frequency of Communication with Friends and Family: Not on file  . Frequency of Social Gatherings  with Friends and Family: Not on file  . Attends Religious Services: Not on file  . Active Member of Clubs or Organizations: Not on file  . Attends Banker Meetings: Not on file  . Marital Status: Not on file  Intimate Partner Violence:   . Fear of Current or Ex-Partner: Not on file  . Emotionally Abused: Not on file  . Physically Abused: Not on file  . Sexually Abused: Not on file   Family History  Problem Relation Age of Onset  . Cancer Paternal Uncle        unknown  . Cancer Paternal Grandmother        unknown  . Diabetes Maternal Grandfather   . Alcohol abuse Paternal Grandfather     OBJECTIVE:  Vitals:   03/26/20 0941  BP:  (!) 155/80  Pulse: 76  Resp: 19  Temp: 98.1 F (36.7 C)  TempSrc: Oral  SpO2: 97%    General appearance: alert; no distress Eyes: PERRLA; EOMI HENT: normocephalic; atraumatic Neck: supple with FROM; negative carotid bruits Lungs: clear to auscultation bilaterally Heart: regular rate and rhythm.   Extremities: no edema; symmetrical with no gross deformities Skin: warm and dry Neurologic: CN 2-12 grossly intact; finger to nose without difficulty; normal gait; strength and sensation intact bilaterally about the upper and lower extremities; negative pronator drift Psychological: alert and cooperative; normal mood and affect   ASSESSMENT & PLAN:  1. Elevated blood pressure reading   2. Acute nonintractable headache, unspecified headache type     Meds ordered this encounter  Medications  . amLODipine (NORVASC) 5 MG tablet    Sig: Take 1 tablet (5 mg total) by mouth daily.    Dispense:  30 tablet    Refill:  0    Order Specific Question:   Supervising Provider    Answer:   Eustace Moore [[id number]]  . ketorolac (TORADOL) 30 MG/ML injection 30 mg  . dexamethasone (DECADRON) injection 10 mg   Elevated blood pressure:  Amlodipine prescribed Please continue to monitor blood pressure at home and keep a log Eat a well balanced diet of fruits, vegetables and lean meats.  Avoid foods high in fat and salt Drink water.  At least half your body weight in ounces Exercise for at least 30 minutes daily Follow up with PCP for further evaluation and management.  We do not management blood pressure in urgent care.   Return or go to the ED if you have any new or worsening symptoms such as vision changes, fatigue, dizziness, chest pain, shortness of breath, nausea, swelling in your hands or feet, urinary symptoms, etc...   Headache:  Migraine cocktail given in office Rest and drink plenty of fluids Use OTC medications as needed for symptomatic relief Follow up with PCP if symptoms  persists Return or go to the ER if you have any new or worsening symptoms such as fever, chills, nausea, vomiting, chest pain, shortness of breath, cough, vision changes, worsening headache despite treatment, slurred speech, facial asymmetry, weakness in arms or legs, etc...  Reviewed expectations re: course of current medical issues. Questions answered. Outlined signs and symptoms indicating need for more acute intervention. Patient verbalized understanding. After Visit Summary given.   Rennis Harding, PA-C 03/26/20 1007

## 2020-03-26 NOTE — Discharge Instructions (Signed)
Elevated blood pressure:  Amlodipine prescribed Please continue to monitor blood pressure at home and keep a log Eat a well balanced diet of fruits, vegetables and lean meats.  Avoid foods high in fat and salt Drink water.  At least half your body weight in ounces Exercise for at least 30 minutes daily Follow up with PCP for further evaluation and management.  We do not management blood pressure in urgent care.   Return or go to the ED if you have any new or worsening symptoms such as vision changes, fatigue, dizziness, chest pain, shortness of breath, nausea, swelling in your hands or feet, urinary symptoms, etc...   Headache:  Migraine cocktail given in office Rest and drink plenty of fluids Use OTC medications as needed for symptomatic relief Follow up with PCP if symptoms persists Return or go to the ER if you have any new or worsening symptoms such as fever, chills, nausea, vomiting, chest pain, shortness of breath, cough, vision changes, worsening headache despite treatment, slurred speech, facial asymmetry, weakness in arms or legs, etc..Marland Kitchen

## 2020-03-27 ENCOUNTER — Encounter: Payer: Self-pay | Admitting: Family Medicine

## 2020-03-27 ENCOUNTER — Other Ambulatory Visit: Payer: BC Managed Care – PPO

## 2020-03-27 ENCOUNTER — Ambulatory Visit: Payer: BC Managed Care – PPO | Admitting: Family Medicine

## 2020-03-27 VITALS — BP 142/92 | HR 87 | Temp 98.2°F | Ht 65.0 in | Wt 197.6 lb

## 2020-03-27 DIAGNOSIS — I10 Essential (primary) hypertension: Secondary | ICD-10-CM

## 2020-03-27 DIAGNOSIS — R0789 Other chest pain: Secondary | ICD-10-CM | POA: Diagnosis not present

## 2020-03-27 NOTE — Progress Notes (Signed)
Patient ID: [name], female    DOB: [birth year], 39 y.o.   MRN: [id number]   Chief Complaint  Patient presents with  . Follow-up    elevated blood pressure- was iven script for Norvasc but has not started without talking to PCP   Subjective:  CC: elevated blood pressure   Presents today with a concern of elevated blood pressure.  On Monday she was having a headache and blurry vision, reports chest tightness that has since resolved.  She denies shortness of breath.  Has a very stressful lifestyle, works night shift, is only sleeping 3-1/2 to 4 hours per night during the week.  She is still grieving the loss of her mother.  She went to the urgent care on Monday, where they gave her a prescription for amlodipine, she did not want to start that until she was seen by this practice.    Medical History Ruthanne has a past medical history of BV (bacterial vaginosis) (12/30/2014), Heart murmur, Herpes simplex virus (HSV) infection, Labial irritation (02/05/2015), Menorrhagia (03/06/2013), Menstrual cramps (02/05/2015), Positive test for herpes simplex virus (HSV) antibody (12/31/2014), and Vaginal irritation (12/30/2014).   Outpatient Encounter Medications as of 03/27/2020  Medication Sig  . amLODipine (NORVASC) 5 MG tablet Take 1 tablet (5 mg total) by mouth daily.   No facility-administered encounter medications on file as of 03/27/2020.     Review of Systems  Constitutional: Negative for chills and fever.  Eyes: Positive for visual disturbance.       Blurry vision on Monday  Respiratory: Positive for chest tightness. Negative for shortness of breath.   Cardiovascular: Negative for chest pain.  Gastrointestinal: Negative for abdominal pain.     Vitals BP (!) 142/92   Pulse 87   Temp 98.2 F (36.8 C) (Oral)   Ht 5\' 5"  (1.651 m)   Wt 197 lb 9.6 oz (89.6 kg)   SpO2 99%   BMI 32.88 kg/m   Objective:   Physical Exam Vitals and nursing note reviewed.  Constitutional:       General: She is not in acute distress.    Appearance: Normal appearance.  Cardiovascular:     Rate and Rhythm: Normal rate and regular rhythm.     Heart sounds: Normal heart sounds.  Pulmonary:     Effort: Pulmonary effort is normal.     Breath sounds: Normal breath sounds.  Skin:    General: Skin is warm and dry.  Neurological:     General: No focal deficit present.     Mental Status: She is alert and oriented to person, place, and time.  Psychiatric:        Behavior: Behavior normal.        Thought Content: Thought content normal.        Judgment: Judgment normal.     Comments: Continues to grieve the loss of her mother.  PHQ-9: 11. Denies thoughts of self-harm.      Assessment and Plan   1. Chest tightness - PR ELECTROCARDIOGRAM, COMPLETE  2. Essential hypertension - CBC - Comprehensive Metabolic Panel (CMET) - Lipid Panel w/o Chol/HDL Ratio   EKG done today, due to the chest tightness that she experienced on Monday.  This was reviewed with [facility] in detail, no evidence of ischemia noted today.  She will get her lab work done on the way out and the results will be given to her once they become available.  She is encouraged to improve her schedule, so that she can  increase her sleeping to at least 6 hours/day.  She will start the amlodipine that was prescribed by the urgent care, she will keep blood pressure logs and she will follow-up in 2 weeks with this information.  Significant amount of time spent today discussing lifestyle modification, sodium restriction, adequate sleep, stress relief.  She plans to work on a different routine for picking her children up from school to allow her to get a few more hours of sleep.  Agrees with plan of care discussed today. Understands warning signs to seek further care: Chest pain, shortness of breath, any significant changes in health, headaches that wake you up at night.  She will monitor her blood pressure keep a log and let us  know if the amlodipine is not controlling it.  Encouraged stress reduction, adequate sleep, and exercise along with a low-sodium diet.  Educational information given at this visit. Understands to follow-up in 2 weeks, sooner if needed.  Will notify of lab results once they become available.  Dorena Bodo, FNP-C

## 2020-03-27 NOTE — Patient Instructions (Addendum)
Try to get more sleep. Start the medication the Urgent Care gave  You. Follow-up in 2 weeks Try to establish a sleep routine.    DASH Eating Plan DASH stands for "Dietary Approaches to Stop Hypertension." The DASH eating plan is a healthy eating plan that has been shown to reduce high blood pressure (hypertension). It may also reduce your risk for type 2 diabetes, heart disease, and stroke. The DASH eating plan may also help with weight loss. What are tips for following this plan?  General guidelines  Avoid eating more than 2,300 mg (milligrams) of salt (sodium) a day. If you have hypertension, you may need to reduce your sodium intake to 1,500 mg a day.  Limit alcohol intake to no more than 1 drink a day for nonpregnant women and 2 drinks a day for men. One drink equals 12 oz of beer, 5 oz of wine, or 1 oz of hard liquor.  Work with your health care provider to maintain a healthy body weight or to lose weight. Ask what an ideal weight is for you.  Get at least 30 minutes of exercise that causes your heart to beat faster (aerobic exercise) most days of the week. Activities may include walking, swimming, or biking.  Work with your health care provider or diet and nutrition specialist (dietitian) to adjust your eating plan to your individual calorie needs. Reading food labels   Check food labels for the amount of sodium per serving. Choose foods with less than 5 percent of the Daily Value of sodium. Generally, foods with less than 300 mg of sodium per serving fit into this eating plan.  To find whole grains, look for the word "whole" as the first word in the ingredient list. Shopping  Buy products labeled as "low-sodium" or "no salt added."  Buy fresh foods. Avoid canned foods and premade or frozen meals. Cooking  Avoid adding salt when cooking. Use salt-free seasonings or herbs instead of table salt or sea salt. Check with your health care provider or pharmacist before using salt  substitutes.  Do not fry foods. Cook foods using healthy methods such as baking, boiling, grilling, and broiling instead.  Cook with heart-healthy oils, such as olive, canola, soybean, or sunflower oil. Meal planning  Eat a balanced diet that includes: ? 5 or more servings of fruits and vegetables each day. At each meal, try to fill half of your plate with fruits and vegetables. ? Up to 6-8 servings of whole grains each day. ? Less than 6 oz of lean meat, poultry, or fish each day. A 3-oz serving of meat is about the same size as a deck of cards. One egg equals 1 oz. ? 2 servings of low-fat dairy each day. ? A serving of nuts, seeds, or beans 5 times each week. ? Heart-healthy fats. Healthy fats called Omega-3 fatty acids are found in foods such as flaxseeds and coldwater fish, like sardines, salmon, and mackerel.  Limit how much you eat of the following: ? Canned or prepackaged foods. ? Food that is high in trans fat, such as fried foods. ? Food that is high in saturated fat, such as fatty meat. ? Sweets, desserts, sugary drinks, and other foods with added sugar. ? Full-fat dairy products.  Do not salt foods before eating.  Try to eat at least 2 vegetarian meals each week.  Eat more home-cooked food and less restaurant, buffet, and fast food.  When eating at a restaurant, ask that your food be  prepared with less salt or no salt, if possible. What foods are recommended? The items listed may not be a complete list. Talk with your dietitian about what dietary choices are best for you. Grains Whole-grain or whole-wheat bread. Whole-grain or whole-wheat pasta. Bitton rice. Modena Morrow. Bulgur. Whole-grain and low-sodium cereals. Pita bread. Low-fat, low-sodium crackers. Whole-wheat flour tortillas. Vegetables Fresh or frozen vegetables (raw, steamed, roasted, or grilled). Low-sodium or reduced-sodium tomato and vegetable juice. Low-sodium or reduced-sodium tomato sauce and tomato  paste. Low-sodium or reduced-sodium canned vegetables. Fruits All fresh, dried, or frozen fruit. Canned fruit in natural juice (without added sugar). Meat and other protein foods Skinless chicken or [place]. Ground chicken or [place]. Pork with fat trimmed off. Fish and seafood. Egg whites. Dried beans, peas, or lentils. Unsalted nuts, nut butters, and seeds. Unsalted canned beans. Lean cuts of beef with fat trimmed off. Low-sodium, lean deli meat. Dairy Low-fat (1%) or fat-free (skim) milk. Fat-free, low-fat, or reduced-fat cheeses. Nonfat, low-sodium ricotta or cottage cheese. Low-fat or nonfat yogurt. Low-fat, low-sodium cheese. Fats and oils Soft margarine without trans fats. Vegetable oil. Low-fat, reduced-fat, or light mayonnaise and salad dressings (reduced-sodium). Canola, safflower, olive, soybean, and sunflower oils. Avocado. Seasoning and other foods Herbs. Spices. Seasoning mixes without salt. Unsalted popcorn and pretzels. Fat-free sweets. What foods are not recommended? The items listed may not be a complete list. Talk with your dietitian about what dietary choices are best for you. Grains Baked goods made with fat, such as croissants, muffins, or some breads. Dry pasta or rice meal packs. Vegetables Creamed or fried vegetables. Vegetables in a cheese sauce. Regular canned vegetables (not low-sodium or reduced-sodium). Regular canned tomato sauce and paste (not low-sodium or reduced-sodium). Regular tomato and vegetable juice (not low-sodium or reduced-sodium). Angie Fava. Olives. Fruits Canned fruit in a light or heavy syrup. Fried fruit. Fruit in cream or butter sauce. Meat and other protein foods Fatty cuts of meat. Ribs. Fried meat. Berniece Salines. Sausage. Bologna and other processed lunch meats. Salami. Fatback. Hotdogs. Bratwurst. Salted nuts and seeds. Canned beans with added salt. Canned or smoked fish. Whole eggs or egg yolks. Chicken or [place] with skin. Dairy Whole or 2% milk,  cream, and half-and-half. Whole or full-fat cream cheese. Whole-fat or sweetened yogurt. Full-fat cheese. Nondairy creamers. Whipped toppings. Processed cheese and cheese spreads. Fats and oils Butter. Stick margarine. Lard. Shortening. Ghee. Bacon fat. Tropical oils, such as coconut, palm kernel, or palm oil. Seasoning and other foods Salted popcorn and pretzels. Onion salt, garlic salt, seasoned salt, table salt, and sea salt. Worcestershire sauce. Tartar sauce. Barbecue sauce. Teriyaki sauce. Soy sauce, including reduced-sodium. Steak sauce. Canned and packaged gravies. Fish sauce. Oyster sauce. Cocktail sauce. Horseradish that you find on the shelf. Ketchup. Mustard. Meat flavorings and tenderizers. Bouillon cubes. Hot sauce and Tabasco sauce. Premade or packaged marinades. Premade or packaged taco seasonings. Relishes. Regular salad dressings. Where to find more information:  National Heart, Lung, and [place]: https://wilson-eaton.com/  American Heart Association: www.heart.org Summary  The DASH eating plan is a healthy eating plan that has been shown to reduce high blood pressure (hypertension). It may also reduce your risk for type 2 diabetes, heart disease, and stroke.  With the DASH eating plan, you should limit salt (sodium) intake to 2,300 mg a day. If you have hypertension, you may need to reduce your sodium intake to 1,500 mg a day.  When on the DASH eating plan, aim to eat more fresh fruits and vegetables, whole grains,  lean proteins, low-fat dairy, and heart-healthy fats.  Work with your health care provider or diet and nutrition specialist (dietitian) to adjust your eating plan to your individual calorie needs. This information is not intended to replace advice given to you by your health care provider. Make sure you discuss any questions you have with your health care provider. Document Revised: 04/08/2017 Document Reviewed: 04/19/2016 Elsevier Patient Education  2020 [employer].

## 2020-03-28 LAB — COMPREHENSIVE METABOLIC PANEL
ALT: 16 IU/L (ref 0–32)
AST: 12 IU/L (ref 0–40)
Albumin/Globulin Ratio: 1.7 (ref 1.2–2.2)
Albumin: 4.3 g/dL (ref 3.8–4.8)
Alkaline Phosphatase: 70 IU/L (ref 44–121)
BUN/Creatinine Ratio: 19 (ref 9–23)
BUN: 15 mg/dL (ref 6–20)
Bilirubin Total: <0.2 mg/dL (ref 0.0–1.2)
CO2: 22 mmol/L (ref 20–29)
Calcium: 9.3 mg/dL (ref 8.7–10.2)
Chloride: 104 mmol/L (ref 96–106)
Creatinine, Ser: 0.8 mg/dL (ref 0.57–1.00)
GFR calc Af Amer: 107 mL/min/{1.73_m2} (ref >59)
GFR calc non Af Amer: 93 mL/min/{1.73_m2} (ref >59)
Globulin, Total: 2.6 g/dL (ref 1.5–4.5)
Glucose: 88 mg/dL (ref 65–99)
Potassium: 3.8 mmol/L (ref 3.5–5.2)
Sodium: 142 mmol/L (ref 134–144)
Total Protein: 6.9 g/dL (ref 6.0–8.5)

## 2020-03-28 LAB — LIPID PANEL W/O CHOL/HDL RATIO
Cholesterol, Total: 173 mg/dL (ref 100–199)
HDL: 72 mg/dL (ref >39)
LDL Chol Calc (NIH): 89 mg/dL (ref 0–99)
Triglycerides: 59 mg/dL (ref 0–149)
VLDL Cholesterol Cal: 12 mg/dL (ref 5–40)

## 2020-03-28 LAB — CBC
Hematocrit: 40.5 % (ref 34.0–46.6)
Hemoglobin: 13.2 g/dL (ref 11.1–15.9)
MCH: 29.5 pg (ref 26.6–33.0)
MCHC: 32.6 g/dL (ref 31.5–35.7)
MCV: 90 fL (ref 79–97)
Platelets: 320 10*3/uL (ref 150–450)
RBC: 4.48 x10E6/uL (ref 3.77–5.28)
RDW: 12.7 % (ref 11.7–15.4)
WBC: 13.1 10*3/uL — ABNORMAL HIGH (ref 3.4–10.8)

## 2020-04-01 ENCOUNTER — Other Ambulatory Visit: Payer: Self-pay | Admitting: Family Medicine

## 2020-04-01 DIAGNOSIS — D72829 Elevated white blood cell count, unspecified: Secondary | ICD-10-CM

## 2020-04-07 ENCOUNTER — Other Ambulatory Visit: Payer: Self-pay

## 2020-04-07 ENCOUNTER — Encounter: Payer: Self-pay | Admitting: Adult Health

## 2020-04-07 ENCOUNTER — Ambulatory Visit (INDEPENDENT_AMBULATORY_CARE_PROVIDER_SITE_OTHER): Payer: BC Managed Care – PPO | Admitting: Adult Health

## 2020-04-07 VITALS — BP 126/89 | HR 78 | Ht 65.0 in | Wt 197.0 lb

## 2020-04-07 DIAGNOSIS — Z01419 Encounter for gynecological examination (general) (routine) without abnormal findings: Secondary | ICD-10-CM | POA: Diagnosis not present

## 2020-04-07 NOTE — Progress Notes (Signed)
Patient ID: [name], female   DOB: Jul 06, 1980, 39 y.o.   MRN: [id number] History of Present Illness: [name] is a 40 year old black female, married, G2P2 in for well woman gyn exam, she had a normal pap with negative HPV 10/7/020. She had labs with Dr [name].  PCP is Dr [name].   Current Medications, Allergies, Past Medical History, Past Surgical History, Family History and Social History were reviewed in [name] record.     Review of Systems:  Patient denies any headaches, hearing loss, fatigue, blurred vision, shortness of breath, chest pain, abdominal pain, problems with bowel movements, urination, or intercourse. No joint pain or mood swings. Still has period after ablation but much lighter    Physical Exam:BP 126/89 (BP Location: Left Arm, Patient Position: Sitting, Cuff Size: Normal)   Pulse 78   Ht 5\' 5"  (1.651 m)   Wt 197 lb (89.4 kg)   LMP 03/28/2020   BMI 32.78 kg/m  General:  Well developed, well nourished, no acute distress Skin:  Warm and dry Neck:  Midline trachea, normal thyroid, good ROM, no lymphadenopathy Lungs; Clear to auscultation bilaterally Breast:  No dominant palpable mass, retraction, or nipple discharge Cardiovascular: Regular rate and rhythm Abdomen:  Soft, non tender, no hepatosplenomegaly Pelvic:  External genitalia is normal in appearance, no lesions.  The vagina is normal in appearance. Urethra has no lesions or masses. The cervix is bulbous.  Uterus is felt to be normal size, shape, and contour.  No adnexal masses or tenderness noted.Bladder is non tender, no masses felt. Extremities/musculoskeletal:  No swelling or varicosities noted, no clubbing or cyanosis Psych:  No mood changes, alert and cooperative,seems happy AA is 1 Fall risk is low PHQ 9 score is 2  Upstream - 04/07/20 1153      Pregnancy Intention Screening   Does the patient want to become pregnant in the next year? N/A    Does the patient's partner  want to become pregnant in the next year? N/A    Would the patient like to discuss contraceptive options today? N/A      Contraception Wrap Up   Current Method Female Sterilization    End Method Female Sterilization    Contraception Counseling Provided No           Impression and plan: 1. Encounter for well woman exam with routine gynecological exam Physical in 1 year  Pap in 2023 Mammogram at 40 Labs with PCP

## 2020-04-09 ENCOUNTER — Encounter: Payer: Self-pay | Admitting: Family Medicine

## 2020-04-10 ENCOUNTER — Encounter: Payer: Self-pay | Admitting: Family Medicine

## 2020-04-10 ENCOUNTER — Other Ambulatory Visit: Payer: Self-pay

## 2020-04-10 ENCOUNTER — Ambulatory Visit: Payer: BC Managed Care – PPO | Admitting: Family Medicine

## 2020-04-10 VITALS — BP 110/74 | HR 83 | Temp 97.6°F | Ht 65.0 in | Wt 198.0 lb

## 2020-04-10 DIAGNOSIS — I10 Essential (primary) hypertension: Secondary | ICD-10-CM | POA: Diagnosis not present

## 2020-04-10 MED ORDER — AMLODIPINE BESYLATE 5 MG PO TABS: 5.0000 mg | ORAL_TABLET | Freq: Every day | ORAL | 2 refills | Status: DC

## 2020-04-10 NOTE — Progress Notes (Signed)
Patient ID: [name], female    DOB: Jan 10, 1981, 39 y.o.   MRN: [id number]   Chief Complaint  Patient presents with  . Follow-up   Subjective:  CC: follow-up for blood pressure  This is a follow-up visit for blood pressure.  Was initially seen on November 18 she was prescribed amlodipine by urgent care, started this after the November 18 visit.  Her blood pressure has had an excellent response.  She denies chest pain, shortness of breath, no headaches, no vision changes.  Has been taking her blood pressure most days, brought the log with her today.  She is actively working on lifestyle modifications.   follow up on blood pressure.    Medical History [name] has a past medical history of BV (bacterial vaginosis) (12/30/2014), Heart murmur, Herpes simplex virus (HSV) infection, Labial irritation (02/05/2015), Menorrhagia (03/06/2013), Menstrual cramps (02/05/2015), Positive test for herpes simplex virus (HSV) antibody (12/31/2014), and Vaginal irritation (12/30/2014).   Outpatient Encounter Medications as of 04/10/2020  Medication Sig  . amLODipine (NORVASC) 5 MG tablet Take 1 tablet (5 mg total) by mouth daily.  . [DISCONTINUED] amLODipine (NORVASC) 5 MG tablet Take 1 tablet (5 mg total) by mouth daily.   No facility-administered encounter medications on file as of 04/10/2020.     Review of Systems  Eyes: Negative for visual disturbance.  Respiratory: Negative for shortness of breath.   Cardiovascular: Negative for chest pain.  Gastrointestinal: Negative for abdominal pain.  Neurological: Negative for headaches.     Vitals BP 110/74   Pulse 83   Temp 97.6 F (36.4 C)   Ht 5\' 5"  (1.651 m)   Wt 198 lb (89.8 kg)   LMP 03/28/2020   SpO2 98%   BMI 32.95 kg/m   Objective:   Physical Exam Vitals and nursing note reviewed.  Constitutional:      General: She is not in acute distress.    Appearance: Normal appearance.  Cardiovascular:     Rate and Rhythm: Normal rate  and regular rhythm.     Heart sounds: Normal heart sounds.  Pulmonary:     Effort: Pulmonary effort is normal.     Breath sounds: Normal breath sounds.  Skin:    General: Skin is warm and dry.  Neurological:     Mental Status: She is alert and oriented to person, place, and time.  Psychiatric:        Mood and Affect: Mood normal.        Behavior: Behavior normal.      Assessment and Plan   1. Essential hypertension - amLODipine (NORVASC) 5 MG tablet; Take 1 tablet (5 mg total) by mouth daily.  Dispense: 30 tablet; Refill: 2   She has an excellent response from her amlodipine.  She is still working on increasing her sleep, she works night shift, does not get enough  sleep.  Blood pressure today was excellent 110/74.  Her BP log range 118-127/77-89.  Lab work reviewed from last visit, lipids are great on the CBC she had an elevated white count.  She will repeat CBC with differential in the next couple of weeks at her convenience.  Results will be reviewed with her at that time.  Agrees with plan of care discussed today. Understands warning signs to seek further care: Chest pain, shortness of breath, blood pressures that are consistently greater than 130/80. Understands to follow-up in 3 months for medication management for her blood pressure.  She will let 08-19-1990 know if her  blood pressures are not remaining controlled.  If blood pressures remain controlled at 3 months, she will follow-up at 6 months.  Will notify once CBC results are available.   Dorena Bodo, FNP-C 04/10/2020

## 2020-04-10 NOTE — Patient Instructions (Addendum)
Get CBC drawn anytime between Dec 16- Dec 30 (at your convenience). Take blood pressure pill every day. Take blood pressure once or twice per week and keep a log.  If BP is consistently > 130/80  Follow-up in 3 months.   DASH Eating Plan DASH stands for "Dietary Approaches to Stop Hypertension." The DASH eating plan is a healthy eating plan that has been shown to reduce high blood pressure (hypertension). It may also reduce your risk for type 2 diabetes, heart disease, and stroke. The DASH eating plan may also help with weight loss. What are tips for following this plan?  General guidelines  Avoid eating more than 2,300 mg (milligrams) of salt (sodium) a day. If you have hypertension, you may need to reduce your sodium intake to 1,500 mg a day.  Limit alcohol intake to no more than 1 drink a day for nonpregnant women and 2 drinks a day for men. One drink equals 12 oz of beer, 5 oz of wine, or 1 oz of hard liquor.  Work with your health care provider to maintain a healthy body weight or to lose weight. Ask what an ideal weight is for you.  Get at least 30 minutes of exercise that causes your heart to beat faster (aerobic exercise) most days of the week. Activities may include walking, swimming, or biking.  Work with your health care provider or diet and nutrition specialist (dietitian) to adjust your eating plan to your individual calorie needs. Reading food labels   Check food labels for the amount of sodium per serving. Choose foods with less than 5 percent of the Daily Value of sodium. Generally, foods with less than 300 mg of sodium per serving fit into this eating plan.  To find whole grains, look for the word "whole" as the first word in the ingredient list. Shopping  Buy products labeled as "low-sodium" or "no salt added."  Buy fresh foods. Avoid canned foods and premade or frozen meals. Cooking  Avoid adding salt when cooking. Use salt-free seasonings or herbs instead of  table salt or sea salt. Check with your health care provider or pharmacist before using salt substitutes.  Do not fry foods. Cook foods using healthy methods such as baking, boiling, grilling, and broiling instead.  Cook with heart-healthy oils, such as olive, canola, soybean, or sunflower oil. Meal planning  Eat a balanced diet that includes: ? 5 or more servings of fruits and vegetables each day. At each meal, try to fill half of your plate with fruits and vegetables. ? Up to 6-8 servings of whole grains each day. ? Less than 6 oz of lean meat, poultry, or fish each day. A 3-oz serving of meat is about the same size as a deck of cards. One egg equals 1 oz. ? 2 servings of low-fat dairy each day. ? A serving of nuts, seeds, or beans 5 times each week. ? Heart-healthy fats. Healthy fats called Omega-3 fatty acids are found in foods such as flaxseeds and coldwater fish, like sardines, salmon, and mackerel.  Limit how much you eat of the following: ? Canned or prepackaged foods. ? Food that is high in trans fat, such as fried foods. ? Food that is high in saturated fat, such as fatty meat. ? Sweets, desserts, sugary drinks, and other foods with added sugar. ? Full-fat dairy products.  Do not salt foods before eating.  Try to eat at least 2 vegetarian meals each week.  Eat more home-cooked food and  less restaurant, buffet, and fast food.  When eating at a restaurant, ask that your food be prepared with less salt or no salt, if possible. What foods are recommended? The items listed may not be a complete list. Talk with your dietitian about what dietary choices are best for you. Grains Whole-grain or whole-wheat bread. Whole-grain or whole-wheat pasta. Brown rice. [name]. Bulgur. Whole-grain and low-sodium cereals. Pita bread. Low-fat, low-sodium crackers. Whole-wheat flour tortillas. Vegetables Fresh or frozen vegetables (raw, steamed, roasted, or grilled). Low-sodium or  reduced-sodium tomato and vegetable juice. Low-sodium or reduced-sodium tomato sauce and tomato paste. Low-sodium or reduced-sodium canned vegetables. Fruits All fresh, dried, or frozen fruit. Canned fruit in natural juice (without added sugar). Meat and other protein foods Skinless chicken or [place]. Ground chicken or [place]. Pork with fat trimmed off. Fish and seafood. Egg whites. Dried beans, peas, or lentils. Unsalted nuts, nut butters, and seeds. Unsalted canned beans. Lean cuts of beef with fat trimmed off. Low-sodium, lean deli meat. Dairy Low-fat (1%) or fat-free (skim) milk. Fat-free, low-fat, or reduced-fat cheeses. Nonfat, low-sodium ricotta or cottage cheese. Low-fat or nonfat yogurt. Low-fat, low-sodium cheese. Fats and oils Soft margarine without trans fats. Vegetable oil. Low-fat, reduced-fat, or light mayonnaise and salad dressings (reduced-sodium). Canola, safflower, olive, soybean, and sunflower oils. Avocado. Seasoning and other foods Herbs. Spices. Seasoning mixes without salt. Unsalted popcorn and pretzels. Fat-free sweets. What foods are not recommended? The items listed may not be a complete list. Talk with your dietitian about what dietary choices are best for you. Grains Baked goods made with fat, such as croissants, muffins, or some breads. Dry pasta or rice meal packs. Vegetables Creamed or fried vegetables. Vegetables in a cheese sauce. Regular canned vegetables (not low-sodium or reduced-sodium). Regular canned tomato sauce and paste (not low-sodium or reduced-sodium). Regular tomato and vegetable juice (not low-sodium or reduced-sodium). [name]. Olives. Fruits Canned fruit in a light or heavy syrup. Fried fruit. Fruit in cream or butter sauce. Meat and other protein foods Fatty cuts of meat. Ribs. Fried meat. [name]. Sausage. Bologna and other processed lunch meats. Salami. Fatback. Hotdogs. Bratwurst. Salted nuts and seeds. Canned beans with added salt. Canned or  smoked fish. Whole eggs or egg yolks. Chicken or [place] with skin. Dairy Whole or 2% milk, cream, and half-and-half. Whole or full-fat cream cheese. Whole-fat or sweetened yogurt. Full-fat cheese. Nondairy creamers. Whipped toppings. Processed cheese and cheese spreads. Fats and oils Butter. Stick margarine. Lard. Shortening. Ghee. Bacon fat. Tropical oils, such as coconut, palm kernel, or palm oil. Seasoning and other foods Salted popcorn and pretzels. Onion salt, garlic salt, seasoned salt, table salt, and sea salt. Worcestershire sauce. Tartar sauce. Barbecue sauce. Teriyaki sauce. Soy sauce, including reduced-sodium. Steak sauce. Canned and packaged gravies. Fish sauce. Oyster sauce. Cocktail sauce. Horseradish that you find on the shelf. Ketchup. Mustard. Meat flavorings and tenderizers. Bouillon cubes. Hot sauce and Tabasco sauce. Premade or packaged marinades. Premade or packaged taco seasonings. Relishes. Regular salad dressings. Where to find more information:  National Heart, Lung, and [place]: https://[place]-eaton.com/  American Heart Association: www.heart.org Summary  The DASH eating plan is a healthy eating plan that has been shown to reduce high blood pressure (hypertension). It may also reduce your risk for type 2 diabetes, heart disease, and stroke.  With the DASH eating plan, you should limit salt (sodium) intake to 2,300 mg a day. If you have hypertension, you may need to reduce your sodium intake to 1,500 mg a day.  When on the DASH eating plan, aim to eat more fresh fruits and vegetables, whole grains, lean proteins, low-fat dairy, and heart-healthy fats.  Work with your health care provider or diet and nutrition specialist (dietitian) to adjust your eating plan to your individual calorie needs. This information is not intended to replace advice given to you by your health care provider. Make sure you discuss any questions you have with your health care provider. Document  Revised: 04/08/2017 Document Reviewed: 04/19/2016 Elsevier Patient Education  2020 [employer].

## 2020-04-17 ENCOUNTER — Encounter: Payer: Self-pay | Admitting: Family Medicine

## 2020-04-17 NOTE — Telephone Encounter (Signed)
Please call and schedule pt an appt for yeast infection.

## 2020-04-21 ENCOUNTER — Ambulatory Visit: Payer: BC Managed Care – PPO | Admitting: Family Medicine

## 2020-05-01 DIAGNOSIS — D72829 Elevated white blood cell count, unspecified: Secondary | ICD-10-CM | POA: Diagnosis not present

## 2020-05-02 LAB — CBC WITH DIFFERENTIAL/PLATELET
Basophils Absolute: 0 10*3/uL (ref 0.0–0.2)
Basos: 1 %
EOS (ABSOLUTE): 0 10*3/uL (ref 0.0–0.4)
Eos: 1 %
Hematocrit: 39.7 % (ref 34.0–46.6)
Hemoglobin: 13.7 g/dL (ref 11.1–15.9)
Immature Grans (Abs): 0 10*3/uL (ref 0.0–0.1)
Immature Granulocytes: 0 %
Lymphocytes Absolute: 2.2 10*3/uL (ref 0.7–3.1)
Lymphs: 41 %
MCH: 30.2 pg (ref 26.6–33.0)
MCHC: 34.5 g/dL (ref 31.5–35.7)
MCV: 88 fL (ref 79–97)
Monocytes Absolute: 0.6 10*3/uL (ref 0.1–0.9)
Monocytes: 11 %
Neutrophils Absolute: 2.5 10*3/uL (ref 1.4–7.0)
Neutrophils: 46 %
Platelets: 355 10*3/uL (ref 150–450)
RBC: 4.53 x10E6/uL (ref 3.77–5.28)
RDW: 12.7 % (ref 11.7–15.4)
WBC: 5.4 10*3/uL (ref 3.4–10.8)

## 2020-07-14 ENCOUNTER — Other Ambulatory Visit: Payer: Self-pay

## 2020-07-14 ENCOUNTER — Encounter: Payer: Self-pay | Admitting: Family Medicine

## 2020-07-14 ENCOUNTER — Ambulatory Visit (INDEPENDENT_AMBULATORY_CARE_PROVIDER_SITE_OTHER): Payer: BC Managed Care – PPO | Admitting: Family Medicine

## 2020-07-14 VITALS — BP 120/80 | HR 79 | Temp 96.9°F | Ht 65.0 in | Wt 200.8 lb

## 2020-07-14 DIAGNOSIS — I10 Essential (primary) hypertension: Secondary | ICD-10-CM | POA: Diagnosis not present

## 2020-07-14 MED ORDER — AMLODIPINE BESYLATE 5 MG PO TABS: 5.0000 mg | ORAL_TABLET | Freq: Every day | ORAL | 0 refills | Status: DC

## 2020-07-14 NOTE — Progress Notes (Signed)
Pt here for follow up on blood pressure. Pt checks blood pressure at least once per week. Taking Amlodipine daily.  Pt having issues with sleeping. Not able to fall asleep and stay asleep. Pt works 3rd shift.     Patient ID: [name], female    DOB: [birth year], 40 y.o.   MRN: [id number]   Chief Complaint  Patient presents with  . Hypertension   Subjective:  CC: follow-up on blood pressure   This is not a new problem.  Presents today to follow-up on blood pressure.  Was initially seen at this office on December 2, where she had already been started on amlodipine on November 17 by the urgent care.  Reports that she is taking her blood pressure weekly has been in the 120s over 80s.  Her last labs were done on March 27, 2020 kidney and liver functions good at that time.  She had elevated white count at that time repeat CBC done on December 23 white blood count normal.  Continues to work third shift, struggles with adequate sleep, watches her sodium in her diet, and exercises at the gym every morning.  Reports that she has been offered first shift position, date unknown of when this will start.  It is likely that her sleep will improve when she is on day shift.  She denies fever, chills, chest pain, leg swelling, vision changes, headaches.  Reports that she is tolerating the amlodipine well.    Medical History Dianne has a past medical history of BV (bacterial vaginosis) (12/30/2014), Heart murmur, Herpes simplex virus (HSV) infection, Labial irritation (02/05/2015), Menorrhagia (03/06/2013), Menstrual cramps (02/05/2015), Positive test for herpes simplex virus (HSV) antibody (12/31/2014), and Vaginal irritation (12/30/2014).   Outpatient Encounter Medications as of 07/14/2020  Medication Sig  . [DISCONTINUED] amLODipine (NORVASC) 5 MG tablet Take 1 tablet (5 mg total) by mouth daily.  Marland Kitchen amLODipine (NORVASC) 5 MG tablet Take 1 tablet (5 mg total) by mouth daily.   No facility-administered  encounter medications on file as of 07/14/2020.     Review of Systems  Constitutional: Negative for chills and fever.  Respiratory: Negative for shortness of breath.   Cardiovascular: Negative for chest pain and leg swelling.       Report ankles swollen at end of work shift (on feet for 10 hours).  Gastrointestinal: Negative for abdominal pain.     Vitals BP 120/80   Pulse 79   Temp (!) 96.9 F (36.1 C)   Ht 5\' 5"  (1.651 m)   Wt 200 lb 12.8 oz (91.1 kg)   SpO2 99%   BMI 33.41 kg/m   Objective:   Physical Exam Vitals reviewed.  Constitutional:      Appearance: Normal appearance.  Cardiovascular:     Rate and Rhythm: Normal rate and regular rhythm.     Heart sounds: Normal heart sounds.  Pulmonary:     Effort: Pulmonary effort is normal.     Breath sounds: Normal breath sounds.  Skin:    General: Skin is warm and dry.  Neurological:     General: No focal deficit present.     Mental Status: She is alert.  Psychiatric:        Behavior: Behavior normal.      Assessment and Plan   1. Essential hypertension - amLODipine (NORVASC) 5 MG tablet; Take 1 tablet (5 mg total) by mouth daily.  Dispense: 90 tablet; Refill: 0 - Comprehensive Metabolic Panel (CMET)   Blood pressure well controlled.  Hypertension Medication compliance: taking medication daily Denies chest pain, shortness of breath, lower extremity edema, vision changes, headaches.  Pertinent lab work: last labs 03/27/20 ASCVD 10- year risk score:  Monitoring: every 3 months, checks blood pressure weekly Side effects: none Continue current medication regimen: continue amlodipine 5 mg daily as prescribed.  She will monitor the leg swelling at the end of her 10-hour shifts, will consider adding hydrochlorothiazide low-dose at 73-month follow-up.  Agrees with plan of care discussed today. Understands warning signs to seek further care: chest pain, shortness of breath, any significant change in health.   Understands to follow-up in 3 months with labs 1 week prior to that visit.  Lab work paperwork given to her today.  If blood pressure remains stable and lab work stable will go to 3-month follow-up at that time.  Refills sent today.    Novella Olive, NP 07/14/2020

## 2020-07-14 NOTE — Patient Instructions (Signed)

## 2020-07-18 ENCOUNTER — Other Ambulatory Visit: Payer: Self-pay | Admitting: Family Medicine

## 2020-07-18 DIAGNOSIS — I10 Essential (primary) hypertension: Secondary | ICD-10-CM

## 2020-10-07 DIAGNOSIS — I10 Essential (primary) hypertension: Secondary | ICD-10-CM | POA: Diagnosis not present

## 2020-10-08 LAB — COMPREHENSIVE METABOLIC PANEL
ALT: 28 IU/L (ref 0–32)
AST: 20 IU/L (ref 0–40)
Albumin/Globulin Ratio: 1.2 (ref 1.2–2.2)
Albumin: 3.7 g/dL — ABNORMAL LOW (ref 3.8–4.8)
Alkaline Phosphatase: 74 IU/L (ref 44–121)
BUN/Creatinine Ratio: 17 (ref 9–23)
BUN: 12 mg/dL (ref 6–20)
Bilirubin Total: 0.3 mg/dL (ref 0.0–1.2)
CO2: 22 mmol/L (ref 20–29)
Calcium: 8.9 mg/dL (ref 8.7–10.2)
Chloride: 103 mmol/L (ref 96–106)
Creatinine, Ser: 0.72 mg/dL (ref 0.57–1.00)
Globulin, Total: 3.2 g/dL (ref 1.5–4.5)
Glucose: 84 mg/dL (ref 65–99)
Potassium: 4 mmol/L (ref 3.5–5.2)
Sodium: 139 mmol/L (ref 134–144)
Total Protein: 6.9 g/dL (ref 6.0–8.5)
eGFR: 109 mL/min/{1.73_m2} (ref >59)

## 2020-10-14 ENCOUNTER — Ambulatory Visit (INDEPENDENT_AMBULATORY_CARE_PROVIDER_SITE_OTHER): Payer: BC Managed Care – PPO | Admitting: Family Medicine

## 2020-10-14 ENCOUNTER — Other Ambulatory Visit: Payer: Self-pay

## 2020-10-14 ENCOUNTER — Encounter: Payer: Self-pay | Admitting: Family Medicine

## 2020-10-14 DIAGNOSIS — I10 Essential (primary) hypertension: Secondary | ICD-10-CM | POA: Diagnosis not present

## 2020-10-14 MED ORDER — AMLODIPINE BESYLATE 5 MG PO TABS: 5.0000 mg | ORAL_TABLET | Freq: Every day | ORAL | 2 refills | Status: DC

## 2020-10-14 NOTE — Progress Notes (Signed)
Patient ID: [name], female    DOB: [birth year], 40 y.o.   MRN: [id number]   Chief Complaint  Patient presents with   Hypertension   Subjective:    HPI F/u-HTN check up.   HTN Pt compliant with BP meds.  No SEs Denies chest pain, sob, LE swelling, or blurry vision.  Takes amlodipine 5mg .  Pt doing well.  No issues with medications.  Had covid in 11/21 and mild course, lasted about 10 days.  Medical History Rashan has a past medical history of BV (bacterial vaginosis) (12/30/2014), Heart murmur, Herpes simplex virus (HSV) infection, Labial irritation (02/05/2015), Menorrhagia (03/06/2013), Menstrual cramps (02/05/2015), Positive test for herpes simplex virus (HSV) antibody (12/31/2014), and Vaginal irritation (12/30/2014).   Outpatient Encounter Medications as of 10/14/2020  Medication Sig   [DISCONTINUED] amLODipine (NORVASC) 5 MG tablet Take 1 tablet (5 mg total) by mouth daily.   amLODipine (NORVASC) 5 MG tablet Take 1 tablet (5 mg total) by mouth daily.   No facility-administered encounter medications on file as of 10/14/2020.     Review of Systems  Constitutional:  Negative for chills and fever.  HENT:  Negative for congestion, rhinorrhea and sore throat.   Respiratory:  Negative for cough, shortness of breath and wheezing.   Cardiovascular:  Negative for chest pain and leg swelling.  Gastrointestinal:  Negative for abdominal pain, diarrhea, nausea and vomiting.  Genitourinary:  Negative for dysuria and frequency.  Musculoskeletal:  Negative for arthralgias and back pain.  Skin:  Negative for rash.  Neurological:  Negative for dizziness, weakness and headaches.    Vitals BP 138/88   Pulse 67   Temp 97.9 F (36.6 C)   Ht 5\' 5"  (1.651 m)   Wt 205 lb (93 kg)   SpO2 100%   BMI 34.11 kg/m   Objective:   Physical Exam Vitals and nursing note reviewed.  Constitutional:      Appearance: Normal appearance.  HENT:     Head: Normocephalic and atraumatic.      Nose: Nose normal.     Mouth/Throat:     Mouth: Mucous membranes are moist.     Pharynx: Oropharynx is clear.  Eyes:     Extraocular Movements: Extraocular movements intact.     Conjunctiva/sclera: Conjunctivae normal.     Pupils: Pupils are equal, round, and reactive to light.  Cardiovascular:     Rate and Rhythm: Normal rate and regular rhythm.     Pulses: Normal pulses.     Heart sounds: Normal heart sounds.  Pulmonary:     Effort: Pulmonary effort is normal.     Breath sounds: Normal breath sounds. No wheezing, rhonchi or rales.  Musculoskeletal:        General: Normal range of motion.     Right lower leg: No edema.     Left lower leg: No edema.  Skin:    General: Skin is warm and dry.     Findings: No lesion or rash.  Neurological:     General: No focal deficit present.     Mental Status: She is alert and oriented to person, place, and time.  Psychiatric:        Mood and Affect: Mood normal.        Behavior: Behavior normal.     Assessment and Plan   1. Essential hypertension - amLODipine (NORVASC) 5 MG tablet; Take 1 tablet (5 mg total) by mouth daily.  Dispense: 90 tablet; Refill: 2   Htn- suboptimal.  Cont meds and decrease salt and increase in exercising.   Return in about 6 months (around 04/15/2021) for f/u htn.  10/26/2020

## 2020-10-21 ENCOUNTER — Other Ambulatory Visit: Payer: Self-pay

## 2020-10-21 ENCOUNTER — Encounter: Payer: Self-pay | Admitting: Podiatry

## 2020-10-21 ENCOUNTER — Ambulatory Visit (INDEPENDENT_AMBULATORY_CARE_PROVIDER_SITE_OTHER): Payer: BC Managed Care – PPO | Admitting: Podiatry

## 2020-10-21 ENCOUNTER — Ambulatory Visit (INDEPENDENT_AMBULATORY_CARE_PROVIDER_SITE_OTHER): Payer: BC Managed Care – PPO

## 2020-10-21 DIAGNOSIS — M722 Plantar fascial fibromatosis: Secondary | ICD-10-CM

## 2020-10-21 MED ORDER — MELOXICAM 15 MG PO TABS: 15.0000 mg | ORAL_TABLET | Freq: Every day | ORAL | 3 refills | Status: DC

## 2020-10-21 NOTE — Patient Instructions (Signed)

## 2020-10-21 NOTE — Progress Notes (Signed)
  Subjective:  Patient ID: [name], female    DOB: January 03, 1981,  MRN: [id number]  Chief Complaint  Patient presents with   Foot Pain     (xray)(np) right foot heel pain    40 y.o. female presents with the above complaint. History confirmed with patient.  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  Worse when she gets out of bed in the morning  Objective:  Physical Exam: warm, good capillary refill, no trophic changes or ulcerative lesions, normal DP and PT pulses, and normal sensory exam.   Right Foot: point tenderness over the heel pad and point tenderness of the mid plantar fascia     Radiographs: X-ray of the right foot: no fracture, dislocation, swelling or degenerative changes noted Assessment:   1. Plantar fasciitis of right foot      Plan:  Patient was evaluated and treated and all questions answered.  Discussed the etiology and treatment options for plantar fasciitis including stretching, formal physical therapy, supportive shoegears such as a running shoe or sneaker, pre fabricated orthoses, injection therapy, and oral medications. We also discussed the role of surgical treatment of this for patients who do not improve after exhausting non-surgical treatment options.   -XR reviewed with patient -Educated patient on stretching and icing of the affected limb -Plantar fascial brace dispensed -Injection delivered to the plantar fascia of the right foot. -Rx for meloxicam. Educated on use, risks and benefits of the medication  After sterile prep with povidone-iodine solution and alcohol, the right heel was injected with 0.5cc 2% xylocaine plain, 0.5cc 0.5% marcaine plain, 5mg  triamcinolone acetonide, and 2mg  dexamethasone was injected along the medial plantar fascia at the insertion on the plantar calcaneus. The patient tolerated the procedure well without complication.  Return in about 6 weeks (around 12/02/2020) for  recheck plantar fasciitis.

## 2020-10-28 ENCOUNTER — Encounter: Payer: Self-pay | Admitting: Emergency Medicine

## 2020-10-28 ENCOUNTER — Other Ambulatory Visit: Payer: Self-pay

## 2020-10-28 ENCOUNTER — Ambulatory Visit
Admission: EM | Admit: 2020-10-28 | Discharge: 2020-10-28 | Disposition: A | Discharge status: Home / Self Care | Payer: BC Managed Care – PPO | Attending: Family Medicine | Admitting: Family Medicine

## 2020-10-28 DIAGNOSIS — J039 Acute tonsillitis, unspecified: Secondary | ICD-10-CM | POA: Diagnosis not present

## 2020-10-28 DIAGNOSIS — R220 Localized swelling, mass and lump, head: Secondary | ICD-10-CM | POA: Insufficient documentation

## 2020-10-28 LAB — POCT RAPID STREP A (OFFICE): Rapid Strep A Screen: NEGATIVE

## 2020-10-28 MED ORDER — AMOXICILLIN 875 MG PO TABS: 875.0000 mg | ORAL_TABLET | Freq: Two times a day (BID) | ORAL | 0 refills | Status: DC

## 2020-10-28 MED ORDER — PREDNISONE 20 MG PO TABS: 20.0000 mg | ORAL_TABLET | Freq: Every day | ORAL | 0 refills | Status: AC

## 2020-10-28 NOTE — ED Provider Notes (Signed)
[facility]    CSN: [id number] Arrival date & time: 10/28/20  1859      History   Chief Complaint Chief Complaint  Patient presents with   Sore Throat    HPI [name] is a 40 y.o. female.   HPI Patient presents today with throat pain and chills x1 day.  She has a low-grade temperature on arrival today.  Concerned that she may have strep. She has a low grade temperature. No history of recurrent strep or exposure to anyone sick. Past Medical History:  Diagnosis Date   BV (bacterial vaginosis) 12/30/2014   Heart murmur    Herpes simplex virus (HSV) infection    Labial irritation 02/05/2015   Menorrhagia 03/06/2013   Menstrual cramps 02/05/2015   Positive test for herpes simplex virus (HSV) antibody 12/31/2014   Vaginal irritation 12/30/2014    Patient Active Problem List   Diagnosis Date Noted   Chest tightness 03/27/2020   Essential hypertension 03/27/2020   [place] gland cyst of right breast 10/09/2019   History of UTI 04/10/2019   Low back pain without sciatica 04/10/2019   Irregular intermenstrual bleeding 04/20/2018   Encounter for well woman exam with routine gynecological exam 04/20/2018   Vitamin D deficiency 07/04/2015   Other specified abdominal hernia without obstruction or gangrene 07/03/2015   Varicose veins of lower extremities with ulcer (HCC) 07/03/2015   Labial irritation 02/05/2015   Menstrual cramps 02/05/2015   Positive test for herpes simplex virus (HSV) antibody 12/31/2014   Vaginal irritation 12/30/2014   BV (bacterial vaginosis) 12/30/2014   Excessive or frequent menstruation 03/23/2013   Dysmenorrhea 03/23/2013   Menorrhagia 03/06/2013   Abdominal muscle strain 03/06/2012   Diastasis recti 03/06/2012   Tobacco abuse 03/06/2012   TONSILLITIS 05/15/2008   AMENORRHEA 11/30/2006   SYMPTOM, HEADACHE 11/30/2006   PELVIC  PAIN 11/30/2006   ELEVATED BLOOD PRESSURE 11/30/2006   FIBROCYSTIC BREAST DISEASE 05/14/2006    Past  Surgical History:  Procedure Laterality Date   CESAREAN SECTION     [phone]   DILITATION & CURRETTAGE/HYSTROSCOPY WITH THERMACHOICE ABLATION N/A 04/11/2013   Procedure: DILATATION & CURETTAGE/HYSTEROSCOPY WITH THERMACHOICE ABLATION;  Surgeon: Lazaro Arms, MD;  Location: [facility];  Service: Gynecology;  Laterality: N/A;  Total Ablation Therapy Time = 9 minutes 38 seconds; 86-88 deg C   HERNIA REPAIR  2012   LAPAROSCOPIC INCISIONAL / UMBILICAL / VENTRAL HERNIA REPAIR  09/2010   Dr. Lovell Sheehan   TUBAL LIGATION  2011    OB History     Gravida  2   Para  2   Term      Preterm      AB      Living  2      SAB      IAB      Ectopic      Multiple      Live Births  2            Home Medications    Prior to Admission medications   Medication Sig Start Date End Date Taking? Authorizing Provider  amLODipine (NORVASC) 5 MG tablet Take 1 tablet (5 mg total) by mouth daily. 10/14/20   Laroy Apple M, DO  meloxicam (MOBIC) 15 MG tablet Take 1 tablet (15 mg total) by mouth daily. Patient not taking: Reported on 10/28/2020 10/21/20   Edwin Cap, DPM    Family History Family History  Problem Relation Age of Onset   Cancer Paternal Uncle  unknown   Cancer Paternal Grandmother        unknown   Other Mother 8       hole in intestine   Diabetes Maternal Grandfather    Alcohol abuse Paternal Grandfather     Social History Social History   Tobacco Use   Smoking status: Former    Packs/day: 0.25    Years: 5.00    Pack years: 1.25    Types: Cigarettes    Quit date: 02/25/2018    Years since quitting: 2.6   Smokeless tobacco: Never  Vaping Use   Vaping Use: Never used  Substance Use Topics   Alcohol use: Yes    Alcohol/week: 0.0 standard drinks    Comment: occ   Drug use: No     Allergies   Patient has no known allergies.   Review of Systems Review of Systems Pertinent negatives listed in HPI   Physical Exam Triage Vital Signs ED Triage  Vitals  Enc Vitals Group     BP 10/28/20 1933 (!) 152/88     Pulse Rate 10/28/20 1933 (!) 101     Resp 10/28/20 1933 18     Temp 10/28/20 1933 99.3 F (37.4 C)     Temp Source 10/28/20 1933 Temporal     SpO2 10/28/20 1933 98 %     Weight --      Height --      Head Circumference --      Peak Flow --      Pain Score 10/28/20 1934 7     Pain Loc --      Pain Edu? --      Excl. in GC? --    No data found.  Updated Vital Signs BP (!) 152/88 (BP Location: Right Arm)   Pulse (!) 101   Temp 99.3 F (37.4 C) (Temporal)   Resp 18   SpO2 98%   Visual Acuity Right Eye Distance:   Left Eye Distance:   Bilateral Distance:    Right Eye Near:   Left Eye Near:    Bilateral Near:     Physical Exam General Appearance:    Alert, cooperative, no distress  HENT:   ENT exam normal, no neck nodes or sinus tenderness, pharynx erythematous without exudate, and tonsils red, enlarged, with exudate present  Eyes:    PERRL, conjunctiva/corneas clear, EOM's intact       Lungs:     Clear to auscultation bilaterally, respirations unlabored  Heart:    Regular rate and rhythm  Neurologic:   Awake, alert, oriented x 3. No apparent focal neurological           defect.         UC Treatments / Results  Labs (all labs ordered are listed, but only abnormal results are displayed) Labs Reviewed  CULTURE, GROUP A STREP [facility])  POCT RAPID STREP A (OFFICE)    EKG   Radiology No results found.  Procedures Procedures (including critical care time)  Medications Ordered in UC Medications - No data to display  Initial Impression / Assessment and Plan / UC Course  I have reviewed the triage vital signs and the nursing notes.  Pertinent labs & imaging results that were available during my care of the patient were reviewed by me and considered in my medical decision making (see chart for details).    Treating for acute tonsillitis.  Rapid strep negative.  Throat culture pending.  Given overall  appearance of throat  will cover with antibiotics and prednisone as ability to swallow is slightly impaired given the diffuse swelling in the tonsils.  Patient encouraged to continue hydrating well with fluids.  ER precautions given swelling worsens  Final Clinical Impressions(s) / UC Diagnoses   Final diagnoses:  Acute tonsillitis, unspecified etiology  Left facial swelling   Discharge Instructions   None    ED Prescriptions     Medication Sig Dispense Auth. Provider   predniSONE (DELTASONE) 20 MG tablet Take 1 tablet (20 mg total) by mouth daily with breakfast for 3 days. 3 tablet Bing Neighbors, FNP   amoxicillin (AMOXIL) 875 MG tablet Take 1 tablet (875 mg total) by mouth 2 (two) times daily. 20 tablet Bing Neighbors, FNP      PDMP not reviewed this encounter.   Bing Neighbors, FNP 11/03/20 2000

## 2020-10-28 NOTE — ED Triage Notes (Signed)
Pt here for sore throat starting yesterday with chills

## 2020-10-31 LAB — CULTURE, GROUP A STREP (THRC)

## 2020-11-24 ENCOUNTER — Telehealth: Payer: Self-pay

## 2020-11-24 ENCOUNTER — Other Ambulatory Visit: Payer: Self-pay

## 2020-11-24 ENCOUNTER — Ambulatory Visit (INDEPENDENT_AMBULATORY_CARE_PROVIDER_SITE_OTHER): Payer: BC Managed Care – PPO | Admitting: Podiatry

## 2020-11-24 DIAGNOSIS — M722 Plantar fascial fibromatosis: Secondary | ICD-10-CM | POA: Diagnosis not present

## 2020-11-24 NOTE — Telephone Encounter (Signed)
Orders and demographics faxed to [facility] in [place]  Evaluate and treat M72.2 Right Twice weekly for  6 to 8 weeks

## 2020-11-24 NOTE — Progress Notes (Signed)
  Subjective:  Patient ID: [name], female    DOB: 22-Jul-1980,  MRN: [id number]  Chief Complaint  Patient presents with   Plantar Fasciitis    Right foot follow up    40 y.o. female returns for follow-up with the above complaint. History confirmed with patient.  Had some improvement after the injection but the pain is returned  Objective:  Physical Exam: warm, good capillary refill, no trophic changes or ulcerative lesions, normal DP and PT pulses, and normal sensory exam.   Right Foot: point tenderness over the heel pad and point tenderness of the mid plantar fascia, somewhat improved in size visit    Radiographs: X-ray of the right foot: no fracture, dislocation, swelling or degenerative changes noted Assessment:   1. Plantar fasciitis of right foot      Plan:  Patient was evaluated and treated and all questions answered.  Discussed the etiology and treatment options for plantar fasciitis including stretching, formal physical therapy, supportive shoegears such as a running shoe or sneaker, pre fabricated orthoses, injection therapy, and oral medications. We also discussed the role of surgical treatment of this for patients who do not improve after exhausting non-surgical treatment options.   -Think she requires increased mobilization and rest.  I recommended a CAM boot today.  Advised not to drive in it.  She would not be able to wear this at work but should wear it for her all her other activities.  Plantar fascial brace for work. -Continue meloxicam -[facility] referral [facility] to [facility] in [place]. -Repeat injection next visit if not improved  Return in about 6 weeks (around 01/05/2021) for recheck plantar fasciitis.

## 2020-12-19 DIAGNOSIS — M25571 Pain in right ankle and joints of right foot: Secondary | ICD-10-CM | POA: Diagnosis not present

## 2020-12-19 DIAGNOSIS — M25671 Stiffness of right ankle, not elsewhere classified: Secondary | ICD-10-CM | POA: Diagnosis not present

## 2020-12-19 DIAGNOSIS — M722 Plantar fascial fibromatosis: Secondary | ICD-10-CM | POA: Diagnosis not present

## 2020-12-19 DIAGNOSIS — M25572 Pain in left ankle and joints of left foot: Secondary | ICD-10-CM | POA: Diagnosis not present

## 2020-12-23 DIAGNOSIS — M722 Plantar fascial fibromatosis: Secondary | ICD-10-CM | POA: Diagnosis not present

## 2020-12-23 DIAGNOSIS — M25671 Stiffness of right ankle, not elsewhere classified: Secondary | ICD-10-CM | POA: Diagnosis not present

## 2020-12-23 DIAGNOSIS — M25571 Pain in right ankle and joints of right foot: Secondary | ICD-10-CM | POA: Diagnosis not present

## 2020-12-23 DIAGNOSIS — M25572 Pain in left ankle and joints of left foot: Secondary | ICD-10-CM | POA: Diagnosis not present

## 2020-12-26 DIAGNOSIS — M722 Plantar fascial fibromatosis: Secondary | ICD-10-CM | POA: Diagnosis not present

## 2020-12-26 DIAGNOSIS — M25571 Pain in right ankle and joints of right foot: Secondary | ICD-10-CM | POA: Diagnosis not present

## 2020-12-26 DIAGNOSIS — M25572 Pain in left ankle and joints of left foot: Secondary | ICD-10-CM | POA: Diagnosis not present

## 2020-12-26 DIAGNOSIS — M25671 Stiffness of right ankle, not elsewhere classified: Secondary | ICD-10-CM | POA: Diagnosis not present

## 2021-01-09 DIAGNOSIS — M25572 Pain in left ankle and joints of left foot: Secondary | ICD-10-CM | POA: Diagnosis not present

## 2021-01-09 DIAGNOSIS — M722 Plantar fascial fibromatosis: Secondary | ICD-10-CM | POA: Diagnosis not present

## 2021-01-09 DIAGNOSIS — M25571 Pain in right ankle and joints of right foot: Secondary | ICD-10-CM | POA: Diagnosis not present

## 2021-01-09 DIAGNOSIS — M25671 Stiffness of right ankle, not elsewhere classified: Secondary | ICD-10-CM | POA: Diagnosis not present

## 2021-01-23 DIAGNOSIS — M722 Plantar fascial fibromatosis: Secondary | ICD-10-CM | POA: Diagnosis not present

## 2021-01-23 DIAGNOSIS — M25572 Pain in left ankle and joints of left foot: Secondary | ICD-10-CM | POA: Diagnosis not present

## 2021-01-23 DIAGNOSIS — M25571 Pain in right ankle and joints of right foot: Secondary | ICD-10-CM | POA: Diagnosis not present

## 2021-01-23 DIAGNOSIS — M25671 Stiffness of right ankle, not elsewhere classified: Secondary | ICD-10-CM | POA: Diagnosis not present

## 2021-02-16 ENCOUNTER — Other Ambulatory Visit: Payer: Self-pay

## 2021-02-16 ENCOUNTER — Ambulatory Visit (INDEPENDENT_AMBULATORY_CARE_PROVIDER_SITE_OTHER): Payer: BC Managed Care – PPO | Admitting: Podiatry

## 2021-02-16 DIAGNOSIS — M722 Plantar fascial fibromatosis: Secondary | ICD-10-CM | POA: Diagnosis not present

## 2021-02-17 NOTE — Progress Notes (Signed)
  Subjective:  Patient ID: [name], female    DOB: 1981-04-10,  MRN: [id number]  Chief Complaint  Patient presents with   Plantar Fasciitis    Follow up right foot    40 y.o. female returns for follow-up with the above complaint. History confirmed with patient.  Overall has improved but still having some pain with it  Objective:  Physical Exam: warm, good capillary refill, no trophic changes or ulcerative lesions, normal DP and PT pulses, and normal sensory exam.   Right Foot: point tenderness over the heel pad and point tenderness of the mid plantar fascia, somewhat improved     Radiographs: X-ray of the right foot: no fracture, dislocation, swelling or degenerative changes noted Assessment:   1. Plantar fasciitis of right foot      Plan:  Patient was evaluated and treated and all questions answered.  Discussed the etiology and treatment options for plantar fasciitis including stretching, formal physical therapy, supportive shoegears such as a running shoe or sneaker, pre fabricated orthoses, injection therapy, and oral medications. We also discussed the role of surgical treatment of this for patients who do not improve after exhausting non-surgical treatment options.   -Transition out of CAM boot.  I recommend another injection today which was performed.   -Continue meloxicam -She will continue home physical therapy  After sterile prep with povidone-iodine solution and alcohol, the right heel was injected with 0.5cc 2% xylocaine plain, 0.5cc 0.5% marcaine plain, 5mg  triamcinolone acetonide, and 2mg  dexamethasone was injected along the medial plantar fascia at the insertion on the plantar calcaneus. The patient tolerated the procedure well without complication.   Return if symptoms worsen or fail to improve, for let me know if it isn't better in 1 month.

## 2021-02-27 ENCOUNTER — Other Ambulatory Visit: Payer: Self-pay

## 2021-02-27 ENCOUNTER — Ambulatory Visit (INDEPENDENT_AMBULATORY_CARE_PROVIDER_SITE_OTHER): Payer: BC Managed Care – PPO | Admitting: Family Medicine

## 2021-02-27 ENCOUNTER — Encounter: Payer: Self-pay | Admitting: Family Medicine

## 2021-02-27 VITALS — BP 129/88 | HR 52 | Temp 100.4°F | Ht 65.0 in | Wt 205.0 lb

## 2021-02-27 DIAGNOSIS — J029 Acute pharyngitis, unspecified: Secondary | ICD-10-CM | POA: Diagnosis not present

## 2021-02-27 LAB — POCT RAPID STREP A (OFFICE): Rapid Strep A Screen: NEGATIVE

## 2021-02-27 MED ORDER — AMOXICILLIN-POT CLAVULANATE 875-125 MG PO TABS: 1.0000 | ORAL_TABLET | Freq: Two times a day (BID) | ORAL | 0 refills | Status: DC

## 2021-02-27 NOTE — Patient Instructions (Signed)
Antibiotic as prescribed while awaiting culture  Call with concerns.  Take care  Dr. Adriana Simas

## 2021-02-27 NOTE — Progress Notes (Signed)
Subjective:  Patient ID: [name], female    DOB: Jul 22, 1980  Age: 40 y.o. MRN: [id number]  CC: Chief Complaint  Patient presents with   Sore Throat    Swollen tonsils-started yesterday.     HPI:  40 year old female presents for an acute visit with complaints of sore throat.  Patient reports that her symptoms started yesterday.  She has had a severe sore throat.  Difficulty swallowing.  She has had some rhinorrhea.  No other respiratory symptoms.  She has not felt feverish.  She currently has a temperature of 100.4.  She states that she has a history of recurrent strep pharyngitis.  She is concerned that she has strep throat.  No reported sick contacts.  No relieving factors.  No other complaints.   Patient Active Problem List   Diagnosis Date Noted   Pharyngitis 02/27/2021   Essential hypertension 03/27/2020   [place] gland cyst of right breast 10/09/2019   Encounter for well woman exam with routine gynecological exam 04/20/2018   Vitamin D deficiency 07/04/2015   Other specified abdominal hernia without obstruction or gangrene 07/03/2015   Varicose veins of lower extremities with ulcer (HCC) 07/03/2015   Positive test for herpes simplex virus (HSV) antibody 12/31/2014   Diastasis recti 03/06/2012   Tobacco abuse 03/06/2012    Social Hx   Social History   Socioeconomic History   Marital status: Married    Spouse name: Casimiro Needle   Number of children: 12   Years of education: 12   Highest education level: Not on file  Occupational History    Comment: [employer]  Tobacco Use   Smoking status: Former    Packs/day: 0.25    Years: 5.00    Pack years: 1.25    Types: Cigarettes    Quit date: 02/25/2018    Years since quitting: 3.0   Smokeless tobacco: Never  Vaping Use   Vaping Use: Never used  Substance and Sexual Activity   Alcohol use: Yes    Alcohol/week: 0.0 standard drinks    Comment: occ   Drug use: No   Sexual activity: Yes    Birth control/protection:  Surgical    Comment: tubal with ablation  Other Topics Concern   Not on file  Social History Narrative   Not on file   Social Determinants of Health   Financial Resource Strain: Low Risk    Difficulty of Paying Living Expenses: Not hard at all  Food Insecurity: No Food Insecurity   Worried About Programme researcher, broadcasting/film/video in the Last Year: Never true   Ran Out of Food in the Last Year: Never true  Transportation Needs: No Transportation Needs   Lack of Transportation (Medical): No   Lack of Transportation (Non-Medical): No  Physical Activity: Inactive   Days of Exercise per Week: 0 days   Minutes of Exercise per Session: 0 min  Stress: No Stress Concern Present   Feeling of Stress : Only a little  Social Connections: Moderately Isolated   Frequency of Communication with Friends and Family: More than three times a week   Frequency of Social Gatherings with Friends and Family: Once a week   Attends Religious Services: Never   Database administrator or Organizations: No   Attends Engineer, structural: Never   Marital Status: Married    Review of Systems  HENT:  Positive for rhinorrhea, sore throat and trouble swallowing.   Respiratory: Negative.      Objective:  BP 129/88  Pulse (!) 52   Temp (!) 100.4 F (38 C)   Ht 5\' 5"  (1.651 m)   Wt 205 lb (93 kg)   SpO2 100%   BMI 34.11 kg/m   BP/Weight 02/27/2021 10/28/2020 10/14/2020  Systolic BP 129 152 138  Diastolic BP 88 88 88  Wt. (Lbs) 205 - 205  BMI 34.11 - 34.11    Physical Exam Vitals and nursing note reviewed.  Constitutional:      General: She is not in acute distress.    Appearance: Normal appearance. She is obese. She is not ill-appearing.  HENT:     Head: Normocephalic and atraumatic.     Mouth/Throat:     Pharynx: Uvula midline. Posterior oropharyngeal erythema present.     Tonsils: Tonsillar exudate present. 1+ on the right. 1+ on the left.  Eyes:     General:        Right eye: No discharge.         Left eye: No discharge.     Conjunctiva/sclera: Conjunctivae normal.  Cardiovascular:     Rate and Rhythm: Normal rate and regular rhythm.  Pulmonary:     Effort: Pulmonary effort is normal.     Breath sounds: Normal breath sounds. No wheezing, rhonchi or rales.  Musculoskeletal:     Cervical back: Tenderness present.  Neurological:     Mental Status: She is alert.    Lab Results  Component Value Date   WBC 5.4 05/01/2020   HGB 13.7 05/01/2020   HCT 39.7 05/01/2020   PLT 355 05/01/2020   GLUCOSE 84 10/07/2020   CHOL 173 03/27/2020   TRIG 59 03/27/2020   HDL 72 03/27/2020   LDLCALC 89 03/27/2020   ALT 28 10/07/2020   AST 20 10/07/2020   NA 139 10/07/2020   K 4.0 10/07/2020   CL 103 10/07/2020   CREATININE 0.72 10/07/2020   BUN 12 10/07/2020   CO2 22 10/07/2020   TSH 0.572 07/03/2015     Assessment & Plan:   Problem List Items Addressed This Visit       Respiratory   Pharyngitis - Primary    Acute illness with systemic symptoms given fever.  Rapid strep was negative.  Given fever and clinical exam as well as her prior history of recurrent strep pharyngitis, I am placing her empirically on Augmentin while awaiting culture.      Relevant Orders   POCT rapid strep A (Completed)   Grp A Strep   Culture, Group A Strep    Meds ordered this encounter  Medications   amoxicillin-clavulanate (AUGMENTIN) 875-125 MG tablet    Sig: Take 1 tablet by mouth 2 (two) times daily.    Dispense:  20 tablet    Refill:  0    Follow-up:  PRN  07/05/2015 DO [facility]

## 2021-02-27 NOTE — Assessment & Plan Note (Signed)
Acute illness with systemic symptoms given fever.  Rapid strep was negative.  Given fever and clinical exam as well as her prior history of recurrent strep pharyngitis, I am placing her empirically on Augmentin while awaiting culture.

## 2021-03-02 LAB — CULTURE, GROUP A STREP: Strep A Culture: NEGATIVE

## 2021-04-10 ENCOUNTER — Other Ambulatory Visit: Payer: BC Managed Care – PPO | Admitting: Adult Health

## 2021-05-25 ENCOUNTER — Other Ambulatory Visit: Payer: Self-pay

## 2021-05-25 ENCOUNTER — Ambulatory Visit (INDEPENDENT_AMBULATORY_CARE_PROVIDER_SITE_OTHER): Payer: BC Managed Care – PPO | Admitting: Podiatry

## 2021-05-25 DIAGNOSIS — M722 Plantar fascial fibromatosis: Secondary | ICD-10-CM

## 2021-05-25 NOTE — Progress Notes (Signed)
°  Subjective:  Patient ID: [name], female    DOB: 22-Sep-1980,  MRN: [id number]  Chief Complaint  Patient presents with   Plantar Fasciitis    Follow up right foot    41 y.o. female returns for follow-up with the above complaint. History confirmed with patient.  Started to get better but has never really fully gone away and its been several months now since she started treatment  Objective:  Physical Exam: warm, good capillary refill, no trophic changes or ulcerative lesions, normal DP and PT pulses, and normal sensory exam.   Right Foot: point tenderness over the heel pad and point tenderness of the mid plantar fascia, still tender    Radiographs: X-ray of the right foot: no fracture, dislocation, swelling or degenerative changes noted, very small plantar calcaneal spur Assessment:   1. Plantar fasciitis of right foot      Plan:  Patient was evaluated and treated and all questions answered.  Discussed the etiology and treatment options for plantar fasciitis including stretching, formal physical therapy, supportive shoegears such as a running shoe or sneaker, pre fabricated orthoses, injection therapy, and oral medications. We also discussed the role of surgical treatment of this for patients who do not improve after exhausting non-surgical treatment options.   -Unfortunately has been consistent and recurrent with her planter fasciitis now for several months.  I discussed further treatment options including plantar fasciotomy with surgical treatment.  I am ordering an MRI to evaluate for edema within the bone marrow of the calcaneus to determine if a bone spur resection is necessary.   Return for after MRI to review.

## 2021-05-25 NOTE — Patient Instructions (Signed)
Call [facility] at [phone] to schedule your MRI ° °

## 2021-05-29 ENCOUNTER — Encounter: Payer: Self-pay | Admitting: Adult Health

## 2021-05-29 ENCOUNTER — Ambulatory Visit (INDEPENDENT_AMBULATORY_CARE_PROVIDER_SITE_OTHER): Payer: BC Managed Care – PPO | Admitting: Adult Health

## 2021-05-29 ENCOUNTER — Other Ambulatory Visit: Payer: Self-pay

## 2021-05-29 ENCOUNTER — Other Ambulatory Visit (HOSPITAL_COMMUNITY)
Admission: RE | Admit: 2021-05-29 | Discharge: 2021-05-29 | Disposition: A | Discharge status: Home / Self Care | Payer: BC Managed Care – PPO | Source: Ambulatory Visit | Attending: Adult Health | Admitting: Adult Health

## 2021-05-29 VITALS — BP 135/86 | HR 87 | Ht 65.0 in | Wt 203.2 lb

## 2021-05-29 DIAGNOSIS — Z1231 Encounter for screening mammogram for malignant neoplasm of breast: Secondary | ICD-10-CM | POA: Insufficient documentation

## 2021-05-29 DIAGNOSIS — Z01419 Encounter for gynecological examination (general) (routine) without abnormal findings: Secondary | ICD-10-CM | POA: Insufficient documentation

## 2021-05-29 DIAGNOSIS — Z1211 Encounter for screening for malignant neoplasm of colon: Secondary | ICD-10-CM | POA: Insufficient documentation

## 2021-05-29 LAB — HEMOCCULT GUIAC POC 1CARD (OFFICE): Fecal Occult Blood, POC: NEGATIVE

## 2021-05-29 NOTE — Progress Notes (Signed)
Patient ID: [name], female   DOB: [birth year], 41 y.o.   MRN: [id number] History of Present Illness: [name] is a 41 year old black female,married, G2P2 in for a well woman gyn exam and pap. She is working at [employer] PCP is Dr Adriana Simas   Current Medications, Allergies, Past Medical History, Past Surgical History, Family History and Social History were reviewed in [name] record.     Review of Systems: Patient denies any headaches, hearing loss, fatigue, blurred vision, shortness of breath, chest pain, abdominal pain, problems with bowel movements, urination, or intercourse. No joint pain or mood swings.  Periods still come monthly after ablation   Physical Exam:BP 135/86 (BP Location: Right Arm, Patient Position: Sitting, Cuff Size: Normal)    Pulse 87    Ht 5\' 5"  (1.651 m)    Wt 203 lb 3.2 oz (92.2 kg)    LMP 05/03/2021    BMI 33.81 kg/m   General:  Well developed, well nourished, no acute distress Skin:  Warm and dry Neck:  Midline trachea, normal thyroid, good ROM, no lymphadenopathy Lungs; Clear to auscultation bilaterally Breast:  No dominant palpable mass, retraction, or nipple discharge Cardiovascular: Regular rate and rhythm Abdomen:  Soft, non tender, no hepatosplenomegaly Pelvic:  External genitalia is normal in appearance, no lesions.  The vagina is normal in appearance. Urethra has no lesions or masses. The cervix is bulbous.Pap with GC/CHL and HR HPV genotyping performed.   Uterus is felt to be normal size, shape, and contour.  No adnexal masses or tenderness noted.Bladder is non tender, no masses felt. Rectal: Good sphincter tone, no polyps, or hemorrhoids felt.  Hemoccult negative. Extremities/musculoskeletal:  No swelling or varicosities noted, no clubbing or cyanosis Psych:  No mood changes, alert and cooperative,seems happy AA is 2 Fall risk is low Depression screen [facility] 2/9 05/29/2021 10/14/2020 07/14/2020  Decreased Interest 1 0 0  Down,  Depressed, Hopeless 0 0 0  PHQ - 2 Score 1 0 0  Altered sleeping 1 - -  Tired, decreased energy 2 - -  Change in appetite 0 - -  Feeling bad or failure about yourself  0 - -  Trouble concentrating 0 - -  Moving slowly or fidgety/restless 0 - -  Suicidal thoughts 0 - -  PHQ-9 Score 4 - -  Difficult doing work/chores - - -    GAD 7 : Generalized Anxiety Score 05/29/2021 04/07/2020 04/11/2017  Nervous, Anxious, on Edge 0 0 2  Control/stop worrying 0 0 3  Worry too much - different things 0 0 3  Trouble relaxing 0 0 2  Restless 0 0 0  Easily annoyed or irritable 0 0 3  Afraid - awful might happen 0 0 0  Total GAD 7 Score 0 0 13  Anxiety Difficulty - - Somewhat difficult      Upstream - 05/29/21 1035       Pregnancy Intention Screening   Does the patient want to become pregnant in the next year? No    Does the patient's partner want to become pregnant in the next year? No    Would the patient like to discuss contraceptive options today? No      Contraception Wrap Up   Current Method Female Sterilization    End Method Female Sterilization    Contraception Counseling Provided No            Examination chaperoned by 05/31/21 LPN  Impression and Plan:  1. Encounter for gynecological  examination with Papanicolaou smear of cervix Pap sent Pap in 3 years if normal Physical in 1 year Labs with PCP Colonoscopy at 45   2. Encounter for screening fecal occult blood testing  3. Screening mammogram for breast cancer Mammogram scheduled for her at [facility] 06/05/21 at 8:30 am

## 2021-06-02 LAB — CYTOLOGY - PAP
Adequacy: ABSENT
Chlamydia: NEGATIVE
Comment: NEGATIVE
Comment: NEGATIVE
Comment: NORMAL
Diagnosis: NEGATIVE
High risk HPV: NEGATIVE
Neisseria Gonorrhea: NEGATIVE

## 2021-06-05 ENCOUNTER — Ambulatory Visit (HOSPITAL_COMMUNITY)
Admission: RE | Admit: 2021-06-05 | Discharge: 2021-06-05 | Disposition: A | Discharge status: Home / Self Care | Payer: BC Managed Care – PPO | Source: Ambulatory Visit | Attending: Adult Health | Admitting: Adult Health

## 2021-06-05 ENCOUNTER — Other Ambulatory Visit: Payer: Self-pay

## 2021-06-05 DIAGNOSIS — Z1231 Encounter for screening mammogram for malignant neoplasm of breast: Secondary | ICD-10-CM | POA: Insufficient documentation

## 2021-06-26 ENCOUNTER — Other Ambulatory Visit: Payer: Self-pay

## 2021-06-26 ENCOUNTER — Ambulatory Visit
Admission: RE | Admit: 2021-06-26 | Discharge: 2021-06-26 | Disposition: A | Discharge status: Home / Self Care | Payer: BC Managed Care – PPO | Source: Ambulatory Visit | Attending: Podiatry | Admitting: Podiatry

## 2021-06-26 DIAGNOSIS — M722 Plantar fascial fibromatosis: Secondary | ICD-10-CM

## 2021-09-17 ENCOUNTER — Other Ambulatory Visit: Payer: Self-pay | Admitting: Family Medicine

## 2021-09-17 DIAGNOSIS — I10 Essential (primary) hypertension: Secondary | ICD-10-CM

## 2021-10-16 ENCOUNTER — Other Ambulatory Visit: Payer: Self-pay | Admitting: *Deleted

## 2021-10-16 DIAGNOSIS — I10 Essential (primary) hypertension: Secondary | ICD-10-CM

## 2021-10-16 MED ORDER — AMLODIPINE BESYLATE 5 MG PO TABS: ORAL_TABLET | ORAL | 0 refills | Status: DC

## 2021-10-27 ENCOUNTER — Ambulatory Visit: Payer: BC Managed Care – PPO | Admitting: Family Medicine

## 2021-10-27 VITALS — BP 140/90 | HR 74 | Temp 97.5°F | Ht 65.0 in | Wt 196.0 lb

## 2021-10-27 DIAGNOSIS — I10 Essential (primary) hypertension: Secondary | ICD-10-CM

## 2021-10-27 DIAGNOSIS — Z13220 Encounter for screening for lipoid disorders: Secondary | ICD-10-CM | POA: Diagnosis not present

## 2021-10-27 DIAGNOSIS — E669 Obesity, unspecified: Secondary | ICD-10-CM | POA: Diagnosis not present

## 2021-10-27 DIAGNOSIS — Z130 Encounter for screening for diseases of the blood and blood-forming organs and certain disorders involving the immune mechanism: Secondary | ICD-10-CM | POA: Diagnosis not present

## 2021-10-27 DIAGNOSIS — Z8719 Personal history of other diseases of the digestive system: Secondary | ICD-10-CM

## 2021-10-27 DIAGNOSIS — Z9889 Other specified postprocedural states: Secondary | ICD-10-CM

## 2021-10-27 NOTE — Patient Instructions (Signed)
I will place the referral.  Continue your medication.  Follow up in 6 months to 1 year.  Take care  Dr. Adriana Simas

## 2021-10-27 NOTE — Assessment & Plan Note (Signed)
Patient reports pain at at the site of prior repair as well as some bulge in the area.  Referring to general surgery Dr. Derrell Lolling.

## 2021-10-27 NOTE — Assessment & Plan Note (Signed)
Hypertension stable.  Continue amlodipine.

## 2021-10-27 NOTE — Progress Notes (Signed)
Subjective:  Patient ID: [name], female    DOB: 1980-11-25  Age: 41 y.o. MRN: [id number]  CC: Chief Complaint  Patient presents with   Establish Care   Hypertension    HPI:  41 year old female presents to establish care.  Patient's hypertension is stable.  She is on amlodipine and tolerating well.  Patient states that she is overall doing well.  She does state that she has ongoing pain in the abdomen around her prior hernia site.  She also feels that there is a bulge in this area.  She states that she does not feel that this is normal.  She is quite bothered by this.  She would like to have another opinion from another Psychologist, sport and exercise.  Patient Active Problem List   Diagnosis Date Noted   S/P repair of ventral hernia 10/27/2021   Encounter for gynecological examination with Papanicolaou smear of cervix 05/29/2021   Essential hypertension 03/27/2020   Vitamin D deficiency 07/04/2015    Social Hx   Social History   Socioeconomic History   Marital status: Married    Spouse name: Legrand Como   Number of children: 48   Years of education: 12   Highest education level: Not on file  Occupational History    Comment: [employer]  Tobacco Use   Smoking status: Former    Packs/day: 0.25    Years: 5.00    Total pack years: 1.25    Types: Cigarettes    Quit date: 02/25/2018    Years since quitting: 3.6   Smokeless tobacco: Never  Vaping Use   Vaping Use: Never used  Substance and Sexual Activity   Alcohol use: Yes    Alcohol/week: 0.0 standard drinks of alcohol    Comment: occ   Drug use: No   Sexual activity: Yes    Birth control/protection: Surgical    Comment: tubal with ablation  Other Topics Concern   Not on file  Social History Narrative   Not on file   Social Determinants of Health   Financial Resource Strain: Low Risk  (05/29/2021)   Overall Financial Resource Strain (CARDIA)    Difficulty of Paying Living Expenses: Not hard at all  Food Insecurity: No Food  Insecurity (05/29/2021)   Hunger Vital Sign    Worried About Running Out of Food in the Last Year: Never true    Ran Out of Food in the Last Year: Never true  Transportation Needs: No Transportation Needs (05/29/2021)   PRAPARE - Hydrologist (Medical): No    Lack of Transportation (Non-Medical): No  Physical Activity: Inactive (05/29/2021)   Exercise Vital Sign    Days of Exercise per Week: 0 days    Minutes of Exercise per Session: 0 min  Stress: No Stress Concern Present (05/29/2021)   [place]    Feeling of Stress : Not at all  Social Connections: Unknown (05/29/2021)   Social Connection and Isolation Panel [NHANES]    Frequency of Communication with Friends and Family: Three times a week    Frequency of Social Gatherings with Friends and Family: Once a week    Attends Religious Services: Patient refused    Active Member of Clubs or Organizations: Patient refused    Attends Archivist Meetings: Patient refused    Marital Status: Married    Review of Systems  Constitutional: Negative.   Respiratory: Negative.    Cardiovascular: Negative.   Gastrointestinal:  Positive for abdominal pain.   Objective:  BP 140/90   Pulse 74   Temp (!) 97.5 F (36.4 C)   Ht _0  (1.651 m)   Wt 196 lb (88.9 kg)   SpO2 99%   BMI 32.62 kg/m      10/27/2021   10:22 AM 05/29/2021   10:39 AM 02/27/2021    2:52 PM  BP/Weight  Systolic BP 161 096 045  Diastolic BP 90 86 88  Wt. (Lbs) 196 203.2 205  BMI 32.62 kg/m2 33.81 kg/m2 34.11 kg/m2    Physical Exam Vitals and nursing note reviewed.  Constitutional:      General: She is not in acute distress.    Appearance: Normal appearance. She is not ill-appearing.  Eyes:     General:        Right eye: No discharge.        Left eye: No discharge.     Conjunctiva/sclera: Conjunctivae normal.  Cardiovascular:     Rate and Rhythm: Normal  rate and regular rhythm.  Pulmonary:     Effort: Pulmonary effort is normal.     Breath sounds: Normal breath sounds. No wheezing, rhonchi or rales.  Abdominal:     General: There is no distension.     Palpations: Abdomen is soft.  Neurological:     Mental Status: She is alert.  Psychiatric:        Mood and Affect: Mood normal.        Behavior: Behavior normal.     Lab Results  Component Value Date   WBC 5.4 05/01/2020   HGB 13.7 05/01/2020   HCT 39.7 05/01/2020   PLT 355 05/01/2020   GLUCOSE 84 10/07/2020   CHOL 173 03/27/2020   TRIG 59 03/27/2020   HDL 72 03/27/2020   LDLCALC 89 03/27/2020   ALT 28 10/07/2020   AST 20 10/07/2020   NA 139 10/07/2020   K 4.0 10/07/2020   CL 103 10/07/2020   CREATININE 0.72 10/07/2020   BUN 12 10/07/2020   CO2 22 10/07/2020   TSH 0.572 07/03/2015     Assessment & Plan:   Problem List Items Addressed This Visit       Cardiovascular and Mediastinum   Essential hypertension - Primary    Hypertension stable.  Continue amlodipine.      Relevant Orders   CMP14+EGFR     Other   S/P repair of ventral hernia    Patient reports pain at at the site of prior repair as well as some bulge in the area.  Referring to general surgery Dr. Rosendo Gros.      Other Visit Diagnoses     Screening, lipid       Relevant Orders   Lipid panel   Obesity (BMI 30.0-34.9)       Relevant Orders   Hemoglobin A1c   Screening for deficiency anemia       Relevant Orders   CBC      Follow-up:  Return in about 6 months (around 04/28/2022).  [place]

## 2021-11-12 DIAGNOSIS — I10 Essential (primary) hypertension: Secondary | ICD-10-CM | POA: Diagnosis not present

## 2021-11-12 DIAGNOSIS — E669 Obesity, unspecified: Secondary | ICD-10-CM | POA: Diagnosis not present

## 2021-11-12 DIAGNOSIS — Z130 Encounter for screening for diseases of the blood and blood-forming organs and certain disorders involving the immune mechanism: Secondary | ICD-10-CM | POA: Diagnosis not present

## 2021-11-12 DIAGNOSIS — Z13220 Encounter for screening for lipoid disorders: Secondary | ICD-10-CM | POA: Diagnosis not present

## 2021-11-13 LAB — CBC
Hematocrit: 40.1 % (ref 34.0–46.6)
Hemoglobin: 13.4 g/dL (ref 11.1–15.9)
MCH: 29.6 pg (ref 26.6–33.0)
MCHC: 33.4 g/dL (ref 31.5–35.7)
MCV: 89 fL (ref 79–97)
Platelets: 312 10*3/uL (ref 150–450)
RBC: 4.52 x10E6/uL (ref 3.77–5.28)
RDW: 13.2 % (ref 11.7–15.4)
WBC: 3.7 10*3/uL (ref 3.4–10.8)

## 2021-11-13 LAB — CMP14+EGFR
ALT: 21 IU/L (ref 0–32)
AST: 37 IU/L (ref 0–40)
Albumin/Globulin Ratio: 1.3 (ref 1.2–2.2)
Albumin: 3.9 g/dL (ref 3.8–4.8)
Alkaline Phosphatase: 64 IU/L (ref 44–121)
BUN/Creatinine Ratio: 15 (ref 9–23)
BUN: 12 mg/dL (ref 6–24)
Bilirubin Total: 0.4 mg/dL (ref 0.0–1.2)
CO2: 24 mmol/L (ref 20–29)
Calcium: 9.1 mg/dL (ref 8.7–10.2)
Chloride: 103 mmol/L (ref 96–106)
Creatinine, Ser: 0.78 mg/dL (ref 0.57–1.00)
Globulin, Total: 2.9 g/dL (ref 1.5–4.5)
Glucose: 91 mg/dL (ref 70–99)
Potassium: 3.7 mmol/L (ref 3.5–5.2)
Sodium: 139 mmol/L (ref 134–144)
Total Protein: 6.8 g/dL (ref 6.0–8.5)
eGFR: 98 mL/min/{1.73_m2} (ref >59)

## 2021-11-13 LAB — LIPID PANEL
Chol/HDL Ratio: 2.5 ratio (ref 0.0–4.4)
Cholesterol, Total: 198 mg/dL (ref 100–199)
HDL: 79 mg/dL (ref >39)
LDL Chol Calc (NIH): 109 mg/dL — ABNORMAL HIGH (ref 0–99)
Triglycerides: 54 mg/dL (ref 0–149)
VLDL Cholesterol Cal: 10 mg/dL (ref 5–40)

## 2021-11-13 LAB — HEMOGLOBIN A1C
Est. average glucose Bld gHb Est-mCnc: 105 mg/dL
Hgb A1c MFr Bld: 5.3 % (ref 4.8–5.6)

## 2021-11-21 ENCOUNTER — Other Ambulatory Visit: Payer: Self-pay | Admitting: Podiatry

## 2021-12-11 DIAGNOSIS — K432 Incisional hernia without obstruction or gangrene: Secondary | ICD-10-CM | POA: Diagnosis not present

## 2021-12-11 DIAGNOSIS — M6208 Separation of muscle (nontraumatic), other site: Secondary | ICD-10-CM | POA: Diagnosis not present

## 2021-12-18 ENCOUNTER — Other Ambulatory Visit: Payer: Self-pay | Admitting: General Surgery

## 2021-12-18 DIAGNOSIS — K432 Incisional hernia without obstruction or gangrene: Secondary | ICD-10-CM

## 2021-12-18 DIAGNOSIS — M6208 Separation of muscle (nontraumatic), other site: Secondary | ICD-10-CM

## 2022-01-21 ENCOUNTER — Other Ambulatory Visit: Payer: Self-pay | Admitting: Family Medicine

## 2022-01-21 DIAGNOSIS — I10 Essential (primary) hypertension: Secondary | ICD-10-CM

## 2022-01-21 MED ORDER — AMLODIPINE BESYLATE 5 MG PO TABS: ORAL_TABLET | ORAL | 0 refills | Status: DC

## 2022-04-23 ENCOUNTER — Ambulatory Visit: Payer: BC Managed Care – PPO | Admitting: Family Medicine

## 2022-04-23 DIAGNOSIS — I10 Essential (primary) hypertension: Secondary | ICD-10-CM | POA: Diagnosis not present

## 2022-04-23 MED ORDER — AMLODIPINE BESYLATE 5 MG PO TABS: ORAL_TABLET | ORAL | 3 refills | Status: DC

## 2022-04-23 NOTE — Assessment & Plan Note (Signed)
Advised to check her blood pressures at home.  Continuing amlodipine at current dose.  Refilled today.

## 2022-04-23 NOTE — Progress Notes (Signed)
Subjective:  Patient ID: [name], female    DOB: Jun 16, 1980  Age: 41 y.o. MRN: [id number]  CC: Chief Complaint  Patient presents with   Hypertension    HPI:  41 year old female with hypertension presents for follow-up.  BP mildly elevated today.  Patient is now working third shift and is very tired.  She has had caffeine recently as well.  She is compliant with amlodipine.  Needs refill.  Otherwise feeling well.  No other complaints or concerns this time.  Patient Active Problem List   Diagnosis Date Noted   S/P repair of ventral hernia 10/27/2021   Essential hypertension 03/27/2020   Vitamin D deficiency 07/04/2015    Social Hx   Social History   Socioeconomic History   Marital status: Married    Spouse name: Casimiro Needle   Number of children: 12   Years of education: 12   Highest education level: Not on file  Occupational History    Comment: [employer]  Tobacco Use   Smoking status: Former    Packs/day: 0.25    Years: 5.00    Total pack years: 1.25    Types: Cigarettes    Quit date: 02/25/2018    Years since quitting: 4.1   Smokeless tobacco: Never  Vaping Use   Vaping Use: Never used  Substance and Sexual Activity   Alcohol use: Yes    Alcohol/week: 0.0 standard drinks of alcohol    Comment: occ   Drug use: No   Sexual activity: Yes    Birth control/protection: Surgical    Comment: tubal with ablation  Other Topics Concern   Not on file  Social History Narrative   Not on file   Social Determinants of Health   Financial Resource Strain: Low Risk  (05/29/2021)   Overall Financial Resource Strain (CARDIA)    Difficulty of Paying Living Expenses: Not hard at all  Food Insecurity: No Food Insecurity (05/29/2021)   Hunger Vital Sign    Worried About Running Out of Food in the Last Year: Never true    Ran Out of Food in the Last Year: Never true  Transportation Needs: No Transportation Needs (05/29/2021)   PRAPARE - Administrator, Civil Service  (Medical): No    Lack of Transportation (Non-Medical): No  Physical Activity: Inactive (05/29/2021)   Exercise Vital Sign    Days of Exercise per Week: 0 days    Minutes of Exercise per Session: 0 min  Stress: No Stress Concern Present (05/29/2021)   [employer] of Occupational Health - Occupational Stress Questionnaire    Feeling of Stress : Not at all  Social Connections: Unknown (05/29/2021)   Social Connection and Isolation Panel [NHANES]    Frequency of Communication with Friends and Family: Three times a week    Frequency of Social Gatherings with Friends and Family: Once a week    Attends Religious Services: Patient refused    Database administrator or Organizations: Patient refused    Attends Engineer, structural: Patient refused    Marital Status: Married    Review of Systems Per HPI  Objective:  BP (!) 142/92   Pulse 65   Temp 97.7 F (36.5 C)   Ht 5\' 5"  (1.651 m)   Wt 202 lb (91.6 kg)   SpO2 100%   BMI 33.61 kg/m      04/23/2022   10:08 AM 04/23/2022    9:51 AM 10/27/2021   10:22 AM  BP/Weight  Systolic  BP 142 134 140  Diastolic BP 92 92 90  Wt. (Lbs)  202 196  BMI  33.61 kg/m2 32.62 kg/m2    Physical Exam Vitals and nursing note reviewed.  Constitutional:      General: She is not in acute distress.    Appearance: Normal appearance.  HENT:     Head: Normocephalic and atraumatic.  Cardiovascular:     Rate and Rhythm: Normal rate and regular rhythm.  Pulmonary:     Effort: Pulmonary effort is normal.     Breath sounds: Normal breath sounds. No wheezing, rhonchi or rales.  Neurological:     Mental Status: She is alert.  Psychiatric:        Mood and Affect: Mood normal.        Behavior: Behavior normal.     Lab Results  Component Value Date   WBC 3.7 11/12/2021   HGB 13.4 11/12/2021   HCT 40.1 11/12/2021   PLT 312 11/12/2021   GLUCOSE 91 11/12/2021   CHOL 198 11/12/2021   TRIG 54 11/12/2021   HDL 79 11/12/2021   LDLCALC 109  (H) 11/12/2021   ALT 21 11/12/2021   AST 37 11/12/2021   NA 139 11/12/2021   K 3.7 11/12/2021   CL 103 11/12/2021   CREATININE 0.78 11/12/2021   BUN 12 11/12/2021   CO2 24 11/12/2021   TSH 0.572 07/03/2015   HGBA1C 5.3 11/12/2021     Assessment & Plan:   Problem List Items Addressed This Visit       Cardiovascular and Mediastinum   Essential hypertension    Advised to check her blood pressures at home.  Continuing amlodipine at current dose.  Refilled today.      Relevant Medications   amLODipine (NORVASC) 5 MG tablet    Meds ordered this encounter  Medications   amLODipine (NORVASC) 5 MG tablet    Sig: TAKE 1 TABLET(5 MG) BY MOUTH DAILY    Dispense:  90 tablet    Refill:  3    Patient needs to have appt with provider    Follow-up: 6 months  Aneta Hendershott Adriana Simas DO [facility]

## 2022-04-23 NOTE — Patient Instructions (Signed)
Keep an eye on your BP at home.  Continue your medication.  Merry Christmas  Dr. Adriana Simas

## 2022-06-21 ENCOUNTER — Encounter: Payer: Self-pay | Admitting: Adult Health

## 2022-06-21 ENCOUNTER — Ambulatory Visit (INDEPENDENT_AMBULATORY_CARE_PROVIDER_SITE_OTHER): Payer: BC Managed Care – PPO | Admitting: Adult Health

## 2022-06-21 VITALS — BP 138/88 | HR 70 | Ht 65.0 in | Wt 200.0 lb

## 2022-06-21 DIAGNOSIS — Z1211 Encounter for screening for malignant neoplasm of colon: Secondary | ICD-10-CM

## 2022-06-21 DIAGNOSIS — Z01419 Encounter for gynecological examination (general) (routine) without abnormal findings: Secondary | ICD-10-CM

## 2022-06-21 LAB — HEMOCCULT GUIAC POC 1CARD (OFFICE): Fecal Occult Blood, POC: NEGATIVE

## 2022-06-21 NOTE — Progress Notes (Signed)
Patient ID: [name], female   DOB: February 26, 1981, 42 y.o.   MRN: [id number] History of Present Illness:  [name] is a 42 year old black female,married, G2P2 in for a well woman gyn exam.  Last pap was negative HPV and NILM 05/29/20  PCP is Dr Lacinda Axon  Current Medications, Allergies, Past Medical History, Past Surgical History, Family History and Social History were reviewed in [place] record.     Review of Systems: Patient denies any headaches, hearing loss, fatigue, blurred vision, shortness of breath, chest pain, abdominal pain, problems with bowel movements, urination, or intercourse. No joint pain or mood swings.     Physical Exam:BP 138/88 (BP Location: Right Arm, Patient Position: Sitting, Cuff Size: Normal)   Pulse 70   Ht 5' 5"$  (1.651 m)   Wt 200 lb (90.7 kg)   LMP 06/13/2022   BMI 33.28 kg/m   General:  Well developed, well nourished, no acute distress Skin:  Warm and dry Neck:  Midline trachea, normal thyroid, good ROM, no lymphadenopathy Lungs; Clear to auscultation bilaterally Breast:  No dominant palpable mass, retraction, or nipple discharge Cardiovascular: Regular rate and rhythm Abdomen:  Soft, non tender, no hepatosplenomegaly Pelvic:  External genitalia is normal in appearance, no lesions.  The vagina is normal in appearance. Urethra has no lesions or masses. The cervix is bulbous.  Uterus is felt to be normal size, shape, and contour.  No adnexal masses or tenderness noted.Bladder is non tender, no masses felt. Rectal: Good sphincter tone, no polyps, or hemorrhoids felt.  Hemoccult negative. Extremities/musculoskeletal:  No swelling or varicosities noted, no clubbing or cyanosis Psych:  No mood changes, alert and cooperative,seems happy AA is 3 Fall risk is low    06/21/2022   10:44 AM 04/23/2022    9:57 AM 10/27/2021   10:30 AM  Depression screen PHQ 2/9  Decreased Interest 0 0 0  Down, Depressed, Hopeless 0 1 0  PHQ - 2 Score 0  1 0  Altered sleeping 0 1   Tired, decreased energy 2 1   Change in appetite 0 0   Feeling bad or failure about yourself  0 0   Trouble concentrating 0 0   Moving slowly or fidgety/restless 0 0   Suicidal thoughts 0 0   PHQ-9 Score 2 3   Difficult doing work/chores  Not difficult at all        06/21/2022   10:44 AM 04/23/2022    9:57 AM 05/29/2021   10:35 AM 04/07/2020   11:53 AM  GAD 7 : Generalized Anxiety Score  Nervous, Anxious, on Edge 0 0 0 0  Control/stop worrying 0 0 0 0  Worry too much - different things 0 0 0 0  Trouble relaxing 0 0 0 0  Restless 0 0 0 0  Easily annoyed or irritable 0 0 0 0  Afraid - awful might happen 0 0 0 0  Total GAD 7 Score 0 0 0 0  Anxiety Difficulty  Not difficult at all        Upstream - 06/21/22 1041       Pregnancy Intention Screening   Does the patient want to become pregnant in the next year? No    Does the patient's partner want to become pregnant in the next year? No    Would the patient like to discuss contraceptive options today? No      Contraception Wrap Up   Current Method Female Sterilization    End  Method Female Sterilization             Examination chaperoned by Levy Pupa LPN  Impression and Plan: 1. Encounter for well woman exam with routine gynecological exam Pap 2026 Physical in 1 year Labs with PCP Had negative mammogram 06/05/21.  2. Encounter for screening fecal occult blood testing Hemoccult was negative

## 2022-07-20 ENCOUNTER — Other Ambulatory Visit (HOSPITAL_COMMUNITY): Payer: Self-pay | Admitting: Adult Health

## 2022-07-20 DIAGNOSIS — Z1231 Encounter for screening mammogram for malignant neoplasm of breast: Secondary | ICD-10-CM

## 2022-07-26 ENCOUNTER — Ambulatory Visit (HOSPITAL_COMMUNITY)
Admission: RE | Admit: 2022-07-26 | Discharge: 2022-07-26 | Disposition: A | Discharge status: Home / Self Care | Payer: BC Managed Care – PPO | Source: Ambulatory Visit | Attending: Adult Health | Admitting: Adult Health

## 2022-07-26 DIAGNOSIS — Z1231 Encounter for screening mammogram for malignant neoplasm of breast: Secondary | ICD-10-CM | POA: Insufficient documentation

## 2022-10-12 ENCOUNTER — Ambulatory Visit: Payer: BC Managed Care – PPO | Admitting: Family Medicine

## 2022-10-12 ENCOUNTER — Ambulatory Visit (HOSPITAL_COMMUNITY)
Admission: RE | Admit: 2022-10-12 | Discharge: 2022-10-12 | Disposition: A | Discharge status: Home / Self Care | Payer: BC Managed Care – PPO | Source: Ambulatory Visit | Attending: Family Medicine | Admitting: Family Medicine

## 2022-10-12 ENCOUNTER — Telehealth: Payer: Self-pay | Admitting: Family Medicine

## 2022-10-12 ENCOUNTER — Encounter: Payer: Self-pay | Admitting: Family Medicine

## 2022-10-12 VITALS — BP 120/80 | HR 68 | Temp 98.2°F | Ht 65.0 in | Wt 204.0 lb

## 2022-10-12 DIAGNOSIS — I10 Essential (primary) hypertension: Secondary | ICD-10-CM

## 2022-10-12 DIAGNOSIS — R600 Localized edema: Secondary | ICD-10-CM | POA: Insufficient documentation

## 2022-10-12 DIAGNOSIS — M25562 Pain in left knee: Secondary | ICD-10-CM | POA: Insufficient documentation

## 2022-10-12 MED ORDER — MELOXICAM 15 MG PO TABS: 15.0000 mg | ORAL_TABLET | Freq: Every day | ORAL | 0 refills | Status: DC | PRN

## 2022-10-12 NOTE — Patient Instructions (Signed)
We will call with [place] results.  Medication as prescribed.  Rest, elevation.  Take care  Dr. Adriana Simas

## 2022-10-12 NOTE — Telephone Encounter (Signed)
US Venous scheduled - AP will work in now.  They will hold patient and call report to Dr Adriana Simas.  Contacted office with information.

## 2022-10-12 NOTE — Assessment & Plan Note (Signed)
Stable on Amlodipine.  Continue.

## 2022-10-12 NOTE — Progress Notes (Signed)
Subjective:  Patient ID: [name], female    DOB: 02/21/1981  Age: 42 y.o. MRN: [id number]  CC: Chief Complaint  Patient presents with   swelling behind the left knee     2 months     HPI:  42 year old female presents for evaluation of the above.  38-month history of left posterior knee pain.  Worse when she is standing on her feet.  No reported injury.  No relief with over-the-counter analgesics.  Associated swelling.  No other complaints or concerns at this time.  Hypertension well-controlled today on amlodipine.  Patient Active Problem List   Diagnosis Date Noted   Posterior left knee pain 10/12/2022   S/P repair of ventral hernia 10/27/2021   Essential hypertension 03/27/2020   Vitamin D deficiency 07/04/2015    Social Hx   Social History   Socioeconomic History   Marital status: Married    Spouse name: Casimiro Needle   Number of children: 12   Years of education: 12   Highest education level: Not on file  Occupational History    Comment: [employer]  Tobacco Use   Smoking status: Former    Packs/day: 0.25    Years: 5.00    Additional pack years: 0.00    Total pack years: 1.25    Types: Cigarettes    Quit date: 02/25/2018    Years since quitting: 4.6   Smokeless tobacco: Never  Vaping Use   Vaping Use: Never used  Substance and Sexual Activity   Alcohol use: Yes    Alcohol/week: 0.0 standard drinks of alcohol    Comment: occ   Drug use: No   Sexual activity: Yes    Birth control/protection: Surgical    Comment: tubal with ablation  Other Topics Concern   Not on file  Social History Narrative   Not on file   Social Determinants of Health   Financial Resource Strain: Low Risk  (06/21/2022)   Overall Financial Resource Strain (CARDIA)    Difficulty of Paying Living Expenses: Not hard at all  Food Insecurity: No Food Insecurity (06/21/2022)   Hunger Vital Sign    Worried About Running Out of Food in the Last Year: Never true    Ran Out of Food in the  Last Year: Never true  Transportation Needs: No Transportation Needs (06/21/2022)   PRAPARE - Administrator, Civil Service (Medical): No    Lack of Transportation (Non-Medical): No  Physical Activity: Insufficiently Active (06/21/2022)   Exercise Vital Sign    Days of Exercise per Week: 1 day    Minutes of Exercise per Session: 30 min  Stress: No Stress Concern Present (06/21/2022)   [employer] of Occupational Health - Occupational Stress Questionnaire    Feeling of Stress : Not at all  Social Connections: Moderately Integrated (06/21/2022)   Social Connection and Isolation Panel [NHANES]    Frequency of Communication with Friends and Family: Twice a week    Frequency of Social Gatherings with Friends and Family: Once a week    Attends Religious Services: Never    Database administrator or Organizations: Yes    Attends Engineer, structural: Never    Marital Status: Married    Review of Systems Per HPI  Objective:  BP 120/80   Pulse 68   Temp 98.2 F (36.8 C)   Ht 5\' 5"  (1.651 m)   Wt 204 lb (92.5 kg)   SpO2 100%   BMI 33.95 kg/m  10/12/2022    8:31 AM 06/21/2022   10:51 AM 06/21/2022   10:37 AM  BP/Weight  Systolic BP 120 138 148  Diastolic BP 80 88 93  Wt. (Lbs) 204  200  BMI 33.95 kg/m2  33.28 kg/m2    Physical Exam Vitals and nursing note reviewed.  Constitutional:      General: She is not in acute distress.    Appearance: Normal appearance.  HENT:     Head: Normocephalic and atraumatic.  Cardiovascular:     Rate and Rhythm: Normal rate and regular rhythm.  Pulmonary:     Effort: Pulmonary effort is normal.     Breath sounds: Normal breath sounds.  Musculoskeletal:     Comments: Left knee - posterior knee fullness. No tenderness to palpation.   Neurological:     Mental Status: She is alert.  Psychiatric:        Mood and Affect: Mood normal.        Behavior: Behavior normal.     Lab Results  Component Value Date   WBC  3.7 11/12/2021   HGB 13.4 11/12/2021   HCT 40.1 11/12/2021   PLT 312 11/12/2021   GLUCOSE 91 11/12/2021   CHOL 198 11/12/2021   TRIG 54 11/12/2021   HDL 79 11/12/2021   LDLCALC 109 (H) 11/12/2021   ALT 21 11/12/2021   AST 37 11/12/2021   NA 139 11/12/2021   K 3.7 11/12/2021   CL 103 11/12/2021   CREATININE 0.78 11/12/2021   BUN 12 11/12/2021   CO2 24 11/12/2021   TSH 0.572 07/03/2015   HGBA1C 5.3 11/12/2021     Assessment & Plan:   Problem List Items Addressed This Visit       Cardiovascular and Mediastinum   Essential hypertension - Primary    Stable on Amlodipine.  Continue.         Other   Posterior left knee pain    Concern for Baker's cyst.  Mobic as directed. Sending for [place].      Relevant Orders   US Venous Img Lower Unilateral Left   Other Visit Diagnoses     Lower extremity edema       Relevant Orders   US Venous Img Lower Unilateral Left       Meds ordered this encounter  Medications   meloxicam (MOBIC) 15 MG tablet    Sig: Take 1 tablet (15 mg total) by mouth daily as needed.    Dispense:  30 tablet    Refill:  0    Brazos Sandoval DO [facility]

## 2022-10-12 NOTE — Assessment & Plan Note (Signed)
Concern for Baker's cyst.  Mobic as directed. Sending for [place].

## 2022-12-09 ENCOUNTER — Other Ambulatory Visit: Payer: Self-pay

## 2022-12-09 ENCOUNTER — Encounter: Payer: Self-pay | Admitting: Family Medicine

## 2022-12-09 DIAGNOSIS — I10 Essential (primary) hypertension: Secondary | ICD-10-CM

## 2022-12-09 MED ORDER — AMLODIPINE BESYLATE 5 MG PO TABS: ORAL_TABLET | ORAL | 3 refills | Status: DC

## 2022-12-30 ENCOUNTER — Ambulatory Visit: Payer: BC Managed Care – PPO | Admitting: Nurse Practitioner

## 2022-12-30 ENCOUNTER — Encounter: Payer: Self-pay | Admitting: Nurse Practitioner

## 2022-12-30 VITALS — BP 136/86 | HR 61 | Temp 98.4°F | Ht 65.0 in | Wt 205.0 lb

## 2022-12-30 DIAGNOSIS — L989 Disorder of the skin and subcutaneous tissue, unspecified: Secondary | ICD-10-CM

## 2022-12-30 DIAGNOSIS — Z13228 Encounter for screening for other metabolic disorders: Secondary | ICD-10-CM

## 2022-12-30 DIAGNOSIS — I10 Essential (primary) hypertension: Secondary | ICD-10-CM | POA: Diagnosis not present

## 2022-12-30 DIAGNOSIS — Z13220 Encounter for screening for lipoid disorders: Secondary | ICD-10-CM

## 2022-12-30 DIAGNOSIS — Z130 Encounter for screening for diseases of the blood and blood-forming organs and certain disorders involving the immune mechanism: Secondary | ICD-10-CM | POA: Diagnosis not present

## 2022-12-30 DIAGNOSIS — R600 Localized edema: Secondary | ICD-10-CM | POA: Diagnosis not present

## 2022-12-30 DIAGNOSIS — Z1329 Encounter for screening for other suspected endocrine disorder: Secondary | ICD-10-CM

## 2022-12-30 MED ORDER — TRIAMCINOLONE ACETONIDE 0.1 % EX CREA: 1.0000 | TOPICAL_CREAM | Freq: Two times a day (BID) | CUTANEOUS | 0 refills | Status: DC

## 2022-12-30 NOTE — Progress Notes (Signed)
Subjective:    Patient ID: [name], female    DOB: December 06, 1980, 42 y.o.   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  Mole has been there for a long time, itching started about 1 to 2 months ago.  No erythema warmth or drainage. Has also had some issues with peripheral edema.  Mild edema noted at the end of her workday after being on her feet.  Has a job that requires frequent walking and movement.  She had significant edema on a recent trip where she rode in a car all the way to [place].  States her feet were very swollen at that time.  Patient denies any shortness of breath chest pain or symptoms of orthopnea.  Does add salt to her foods.  Does not check her blood pressure on a regular basis outside of the office. Gets preventive health physicals with gynecology.   Review of Systems  Constitutional:  Positive for fatigue.  HENT:  Negative for sore throat and trouble swallowing.   Respiratory:  Negative for cough, chest tightness and shortness of breath.        No orthopnea.  Cardiovascular:  Positive for leg swelling. Negative for chest pain.       Objective:   Physical Exam NAD.  Alert, oriented.  Thyroid nontender to palpation, no mass or goiter noted.  Lungs clear.  Heart regular rate rhythm.  Carotids no bruits or thrills.  Lower extremities trace pitting edema.  Note the patient comes to the office after her night shift.  Well-defined very dark raised circular lesion noted on the left forearm.  Minimal erythema surrounding the lesion.  Nontender to palpation. Today's Vitals   12/30/22 1028  BP: 136/86  Pulse: 61  Temp: 98.4 F (36.9 C)  SpO2: 100%  Weight: 205 lb (93 kg)  Height: 5\' 5"  (1.651 m)   Body mass index is 34.11 kg/m.       Assessment & Plan:   Problem List Items Addressed This Visit       Cardiovascular and Mediastinum   Essential  hypertension - Primary   Relevant Orders   CBC with Differential/Platelet   Comprehensive metabolic panel   Lipid panel   TSH   Other Visit Diagnoses     Peripheral edema       Relevant Orders   Comprehensive metabolic panel   Screening for deficiency anemia       Relevant Orders   CBC with Differential/Platelet   Encounter for screening for other metabolic disorders       Relevant Orders   Comprehensive metabolic panel   Screening for lipid disorders       Relevant Orders   Lipid panel   Screening for thyroid disorder       Relevant Orders   TSH   Skin lesion of left arm       Relevant Orders   Ambulatory referral to Dermatology      Routine labs ordered. Meds ordered this encounter  Medications   triamcinolone cream (KENALOG) 0.1 %    Sig: Apply 1 Application topically 2 (two) times daily. Prn rash; use up to 2 weeks    Dispense:  30 g    Refill:  0    Order Specific Question:   Supervising Provider    Answer:   Babs Sciara [  [id number]]   Triamcinolone as directed for itching.  Refer to dermatology for evaluation and probable removal of the lesion. Discussed lifestyle factors affecting her blood pressure.  Encouraged low-sodium diet, healthy diet and regular activity.  Weight loss goal of 5 to 10 pounds.  She is on low-dose amlodipine for her blood pressure but this may also add to some of her edema. For any prolonged travel where she will be sitting, recommend compression stockings and a dose of low-dose aspirin.  If she is riding in a car tried to stop every couple of hours for some walking.  Warning signs reviewed.  Call back if any further problems. Return in about 6 months (around 07/02/2023).

## 2022-12-31 LAB — COMPREHENSIVE METABOLIC PANEL
ALT: 18 IU/L (ref 0–32)
AST: 20 IU/L (ref 0–40)
Albumin: 4.1 g/dL (ref 3.9–4.9)
Alkaline Phosphatase: 64 IU/L (ref 44–121)
BUN/Creatinine Ratio: 18 (ref 9–23)
BUN: 12 mg/dL (ref 6–24)
Bilirubin Total: 0.3 mg/dL (ref 0.0–1.2)
CO2: 24 mmol/L (ref 20–29)
Calcium: 9.5 mg/dL (ref 8.7–10.2)
Chloride: 102 mmol/L (ref 96–106)
Creatinine, Ser: 0.66 mg/dL (ref 0.57–1.00)
Globulin, Total: 2.9 g/dL (ref 1.5–4.5)
Glucose: 80 mg/dL (ref 70–99)
Potassium: 4.1 mmol/L (ref 3.5–5.2)
Sodium: 137 mmol/L (ref 134–144)
Total Protein: 7 g/dL (ref 6.0–8.5)
eGFR: 113 mL/min/{1.73_m2} (ref >59)

## 2022-12-31 LAB — CBC WITH DIFFERENTIAL/PLATELET
Basophils Absolute: 0 10*3/uL (ref 0.0–0.2)
Basos: 1 %
EOS (ABSOLUTE): 0.1 10*3/uL (ref 0.0–0.4)
Eos: 2 %
Hematocrit: 40.3 % (ref 34.0–46.6)
Hemoglobin: 13.2 g/dL (ref 11.1–15.9)
Immature Grans (Abs): 0 10*3/uL (ref 0.0–0.1)
Immature Granulocytes: 0 %
Lymphocytes Absolute: 2.5 10*3/uL (ref 0.7–3.1)
Lymphs: 43 %
MCH: 29.1 pg (ref 26.6–33.0)
MCHC: 32.8 g/dL (ref 31.5–35.7)
MCV: 89 fL (ref 79–97)
Monocytes Absolute: 0.6 10*3/uL (ref 0.1–0.9)
Monocytes: 11 %
Neutrophils Absolute: 2.5 10*3/uL (ref 1.4–7.0)
Neutrophils: 43 %
Platelets: 304 10*3/uL (ref 150–450)
RBC: 4.54 x10E6/uL (ref 3.77–5.28)
RDW: 13 % (ref 11.7–15.4)
WBC: 5.8 10*3/uL (ref 3.4–10.8)

## 2022-12-31 LAB — LIPID PANEL
Chol/HDL Ratio: 2.4 ratio (ref 0.0–4.4)
Cholesterol, Total: 178 mg/dL (ref 100–199)
HDL: 73 mg/dL (ref >39)
LDL Chol Calc (NIH): 95 mg/dL (ref 0–99)
Triglycerides: 51 mg/dL (ref 0–149)
VLDL Cholesterol Cal: 10 mg/dL (ref 5–40)

## 2022-12-31 LAB — TSH: TSH: 1.56 u[IU]/mL (ref 0.450–4.500)

## 2023-06-06 ENCOUNTER — Other Ambulatory Visit (HOSPITAL_COMMUNITY): Payer: Self-pay | Admitting: Adult Health

## 2023-06-06 DIAGNOSIS — Z1231 Encounter for screening mammogram for malignant neoplasm of breast: Secondary | ICD-10-CM

## 2023-07-15 ENCOUNTER — Ambulatory Visit: Payer: BC Managed Care – PPO | Admitting: Adult Health

## 2023-07-15 ENCOUNTER — Encounter: Payer: Self-pay | Admitting: Adult Health

## 2023-07-15 VITALS — BP 145/93 | HR 65 | Ht 65.0 in | Wt 207.5 lb

## 2023-07-15 DIAGNOSIS — Z1211 Encounter for screening for malignant neoplasm of colon: Secondary | ICD-10-CM | POA: Insufficient documentation

## 2023-07-15 DIAGNOSIS — Z01419 Encounter for gynecological examination (general) (routine) without abnormal findings: Secondary | ICD-10-CM | POA: Diagnosis not present

## 2023-07-15 DIAGNOSIS — I10 Essential (primary) hypertension: Secondary | ICD-10-CM | POA: Diagnosis not present

## 2023-07-15 DIAGNOSIS — Z1331 Encounter for screening for depression: Secondary | ICD-10-CM

## 2023-07-15 DIAGNOSIS — M25473 Effusion, unspecified ankle: Secondary | ICD-10-CM | POA: Diagnosis not present

## 2023-07-15 LAB — HEMOCCULT GUIAC POC 1CARD (OFFICE): Fecal Occult Blood, POC: NEGATIVE

## 2023-07-15 MED ORDER — HYDROCHLOROTHIAZIDE 12.5 MG PO CAPS: 12.5000 mg | ORAL_CAPSULE | Freq: Every day | ORAL | 6 refills | Status: DC

## 2023-07-15 NOTE — Progress Notes (Signed)
 Patient ID: [name], female   DOB: 04-18-1981, 43 y.o.   MRN: [id number] History of Present Illness: [name] is a 43 year old black female,married, G2P2 in for a well woman gyn exam. She works third shift and has swelling in ankles at times. December period 11 days late.     Component Value Date/Time   DIAGPAP  05/29/2021 1041    - Negative for intraepithelial lesion or malignancy (NILM)   DIAGPAP  02/14/2019 1113    - Negative for intraepithelial lesion or malignancy (NILM)   HPVHIGH Negative 05/29/2021 1041   HPVHIGH Negative 02/14/2019 1113   ADEQPAP  05/29/2021 1041    Satisfactory for evaluation; transformation zone component ABSENT.   ADEQPAP  02/14/2019 1113    Satisfactory for evaluation; transformation zone component ABSENT.   PCP is Dr Adriana Simas   Current Medications, Allergies, Past Medical History, Past Surgical History, Family History and Social History were reviewed in [facility] electronic medical record.     Review of Systems: Patient denies any headaches, hearing loss, fatigue, blurred vision, shortness of breath, chest pain, abdominal pain, problems with bowel movements, urination, or intercourse. No joint pain or mood swings.  See HPI for positives.   Physical Exam:BP (!) 145/93 (BP Location: Right Arm, Patient Position: Sitting, Cuff Size: Normal)   Pulse 65   Ht 5\' 5"  (1.651 m)   Wt 207 lb 8 oz (94.1 kg)   LMP 06/29/2023   BMI 34.53 kg/m   General:  Well developed, well nourished, no acute distress Skin:  Warm and dry Neck:  Midline trachea, normal thyroid, good ROM, no lymphadenopathy Lungs; Clear to auscultation bilaterally Breast:  No dominant palpable mass, retraction, or nipple discharge Cardiovascular: Regular rate and rhythm Abdomen:  Soft, non tender, no hepatosplenomegaly Pelvic:  External genitalia is normal in appearance, no lesions.  The vagina is normal in appearance. Urethra has no lesions or masses. The cervix is bulbous.  Uterus is  felt to be normal size, shape, and contour.  No adnexal masses or tenderness noted.Bladder is non tender, no masses felt. Rectal: Good sphincter tone, no polyps, or hemorrhoids felt.  Hemoccult negative. Extremities/musculoskeletal:  No swelling or varicosities noted, no clubbing or cyanosis Psych:  No mood changes, alert and cooperative,seems happy AA is 2 Fall risk is low    07/15/2023   10:34 AM 12/30/2022   10:39 AM 10/12/2022    8:40 AM  Depression screen PHQ 2/9  Decreased Interest 0 0 0  Down, Depressed, Hopeless 0 1 0  PHQ - 2 Score 0 1 0  Altered sleeping 0 0 0  Tired, decreased energy 0 1 0  Change in appetite 0 0 0  Feeling bad or failure about yourself  0 0 0  Trouble concentrating 0 0 0  Moving slowly or fidgety/restless 0 0 0  Suicidal thoughts 0 0 0  PHQ-9 Score 0 2 0  Difficult doing work/chores  Not difficult at all Not difficult at all       07/15/2023   10:34 AM 12/30/2022   10:40 AM 10/12/2022    8:41 AM 06/21/2022   10:44 AM  GAD 7 : Generalized Anxiety Score  Nervous, Anxious, on Edge 0 0 0 0  Control/stop worrying 0 0 0 0  Worry too much - different things 0 0 0 0  Trouble relaxing 0 0 0 0  Restless 0 0 0 0  Easily annoyed or irritable 0 0 0 0  Afraid - awful might happen  0 0 0 0  Total GAD 7 Score 0 0 0 0  Anxiety Difficulty  Not difficult at all Not difficult at all       Upstream - 07/15/23 1037       Pregnancy Intention Screening   Does the patient want to become pregnant in the next year? No    Does the patient's partner want to become pregnant in the next year? No    Would the patient like to discuss contraceptive options today? No      Contraception Wrap Up   Current Method Female Sterilization    End Method Female Sterilization    Contraception Counseling Provided No             Examination chaperoned by Malachy Mood LPN  Impression and Plan: 1. Encounter for well woman exam with routine gynecological exam (Primary) Pap and physical  in 1 year Mammogram was negative 07/26/22 has appt 08/01/23 Labs with PCP Stay active   2. Encounter for screening fecal occult blood testing Hemoccult was negative   3. Essential hypertension Take Norvasc 5 mg daily and follow up with PCP  4. Ankle swelling, unspecified laterality Will rx Microzide 12.5 mg 1 daily Meds ordered this encounter  Medications   hydrochlorothiazide (MICROZIDE) 12.5 MG capsule    Sig: Take 1 capsule (12.5 mg total) by mouth daily.    Dispense:  30 capsule    Refill:  6    Supervising Provider:   Duane Lope H [[id number]]

## 2023-07-22 ENCOUNTER — Ambulatory Visit: Payer: BC Managed Care – PPO | Admitting: Nurse Practitioner

## 2023-07-22 VITALS — BP 107/71 | HR 71 | Temp 98.2°F | Ht 65.0 in | Wt 205.4 lb

## 2023-07-22 DIAGNOSIS — M25473 Effusion, unspecified ankle: Secondary | ICD-10-CM | POA: Diagnosis not present

## 2023-07-22 DIAGNOSIS — I10 Essential (primary) hypertension: Secondary | ICD-10-CM | POA: Diagnosis not present

## 2023-07-22 NOTE — Progress Notes (Signed)
   Subjective:    Patient ID: [name], female    DOB: January 15, 1981, 43 y.o.   MRN: [id number]  HPI Presents today to discuss her swelling of her ankles and blood pressure. Was seen by gynecology on 07/15/2023, and had swelling in both of her ankles. Started taking the amlodipine two years ago, and swelling has been occurring since then. Has noticed that her salt intake has been increased lately. Was prescribed hydrochlorothiazide by gynecology. Swelling has improved since starting the medication. Has been getting lightheaded at home. No syncope episodes. Has a blood pressure monitor at home, but does not take it regularly. No new presence of orthopnea, cough, or SOB.    Review of Systems  Constitutional:  Negative for fever.  Respiratory:  Negative for cough, chest tightness, shortness of breath and wheezing.   Cardiovascular:  Negative for chest pain, palpitations and leg swelling.  Neurological:  Positive for light-headedness. Negative for dizziness, syncope, weakness and headaches.  Psychiatric/Behavioral:  Negative for sleep disturbance.       Objective:   Physical Exam Vitals and nursing note reviewed.  Constitutional:      General: She is not in acute distress.    Appearance: Normal appearance. She is not ill-appearing.  Cardiovascular:     Rate and Rhythm: Normal rate and regular rhythm.     Heart sounds: Normal heart sounds. No murmur heard. Pulmonary:     Effort: Pulmonary effort is normal. No respiratory distress.     Breath sounds: Normal breath sounds. No wheezing.  Musculoskeletal:     Comments: Minimal trace pitting edema bilaterally in LE  Neurological:     Mental Status: She is alert.    Vitals:   07/22/23 0833 07/22/23 0940 07/22/23 0941 07/22/23 0942  BP: 107/71 102/82 110/82 112/84  Pulse: 71     Temp: 98.2 F (36.8 C)     Height: 5\' 5"  (1.651 m)     Weight: 205 lb 6.4 oz (93.2 kg)     SpO2: 100%     BMI (Calculated): 34.18       Orthostatic VS for  the past 72 hrs (Last 3 readings):  Orthostatic BP Patient Position  07/22/23 0942 112/84 Standing  07/22/23 0941 110/82 Supine  07/22/23 0940 102/82 Sitting       Assessment & Plan:  1. Ankle swelling, unspecified laterality (Primary) -Ankle swelling mostly resolved on exam.   2. Essential hypertension -Advised to discontinue with Amlodipine due to blood pressures running low and patient having lightheaded spells. Will continue taking the 12.5 mg of hydrochlorothiazide, and monitor blood pressures at home daily. Patient agrees to message on MyChart blood pressure log and will evaluate if any blood pressure medication changes need to be made.  Watch sodium in diet. Given information on DASH diet.   Return in about 3 months (around 10/22/2023).    I have seen and examined this patient alongside the NP student. I have reviewed and verified the student note and agree with the assessment and plan.  Sherie Don, FNP

## 2023-07-22 NOTE — Patient Instructions (Signed)

## 2023-07-23 ENCOUNTER — Encounter: Payer: Self-pay | Admitting: Nurse Practitioner

## 2023-08-01 ENCOUNTER — Ambulatory Visit (HOSPITAL_COMMUNITY): Payer: BC Managed Care – PPO

## 2023-08-08 ENCOUNTER — Ambulatory Visit (HOSPITAL_COMMUNITY)
Admission: RE | Admit: 2023-08-08 | Discharge: 2023-08-08 | Disposition: A | Discharge status: Home / Self Care | Source: Ambulatory Visit | Attending: Adult Health | Admitting: Adult Health

## 2023-08-08 DIAGNOSIS — Z1231 Encounter for screening mammogram for malignant neoplasm of breast: Secondary | ICD-10-CM | POA: Insufficient documentation

## 2023-08-09 ENCOUNTER — Other Ambulatory Visit (HOSPITAL_COMMUNITY): Payer: Self-pay | Admitting: Adult Health

## 2023-08-09 DIAGNOSIS — R928 Other abnormal and inconclusive findings on diagnostic imaging of breast: Secondary | ICD-10-CM

## 2023-08-11 ENCOUNTER — Encounter (HOSPITAL_COMMUNITY): Payer: Self-pay

## 2023-08-11 ENCOUNTER — Ambulatory Visit (HOSPITAL_COMMUNITY)
Admission: RE | Admit: 2023-08-11 | Discharge: 2023-08-11 | Discharge status: Home / Self Care | Source: Ambulatory Visit | Attending: Adult Health | Admitting: Adult Health

## 2023-08-11 ENCOUNTER — Ambulatory Visit (HOSPITAL_COMMUNITY)
Admission: RE | Admit: 2023-08-11 | Discharge: 2023-08-11 | Disposition: A | Discharge status: Home / Self Care | Source: Ambulatory Visit | Attending: Adult Health | Admitting: Adult Health

## 2023-08-11 ENCOUNTER — Other Ambulatory Visit (HOSPITAL_COMMUNITY): Payer: Self-pay | Admitting: Adult Health

## 2023-08-11 DIAGNOSIS — R928 Other abnormal and inconclusive findings on diagnostic imaging of breast: Secondary | ICD-10-CM

## 2023-08-11 DIAGNOSIS — R92333 Mammographic heterogeneous density, bilateral breasts: Secondary | ICD-10-CM | POA: Diagnosis not present

## 2023-08-11 DIAGNOSIS — N6325 Unspecified lump in the left breast, overlapping quadrants: Secondary | ICD-10-CM | POA: Diagnosis not present

## 2023-08-18 ENCOUNTER — Ambulatory Visit (HOSPITAL_COMMUNITY)
Admission: RE | Admit: 2023-08-18 | Discharge: 2023-08-18 | Disposition: A | Discharge status: Home / Self Care | Source: Ambulatory Visit | Attending: Adult Health | Admitting: Adult Health

## 2023-08-18 ENCOUNTER — Other Ambulatory Visit (HOSPITAL_COMMUNITY): Payer: Self-pay | Admitting: Adult Health

## 2023-08-18 ENCOUNTER — Encounter (HOSPITAL_COMMUNITY): Payer: Self-pay

## 2023-08-18 DIAGNOSIS — N6002 Solitary cyst of left breast: Secondary | ICD-10-CM | POA: Diagnosis not present

## 2023-08-18 DIAGNOSIS — R928 Other abnormal and inconclusive findings on diagnostic imaging of breast: Secondary | ICD-10-CM

## 2023-08-18 MED ORDER — LIDOCAINE HCL (PF) 2 % IJ SOLN
INTRAMUSCULAR | Status: AC
  Filled 2023-08-18: qty 10

## 2023-08-18 MED ORDER — LIDOCAINE-EPINEPHRINE (PF) 1 %-1:200000 IJ SOLN
INTRAMUSCULAR | Status: AC
  Filled 2023-08-18: qty 30

## 2023-12-05 ENCOUNTER — Other Ambulatory Visit (HOSPITAL_COMMUNITY): Payer: Self-pay

## 2023-12-05 ENCOUNTER — Ambulatory Visit: Admitting: Family Medicine

## 2023-12-05 VITALS — BP 128/80 | HR 70 | Temp 97.9°F | Ht 65.0 in | Wt 212.8 lb

## 2023-12-05 DIAGNOSIS — R635 Abnormal weight gain: Secondary | ICD-10-CM | POA: Diagnosis not present

## 2023-12-05 DIAGNOSIS — Z6835 Body mass index (BMI) 35.0-35.9, adult: Secondary | ICD-10-CM | POA: Diagnosis not present

## 2023-12-05 DIAGNOSIS — E669 Obesity, unspecified: Secondary | ICD-10-CM | POA: Diagnosis not present

## 2023-12-05 DIAGNOSIS — Z130 Encounter for screening for diseases of the blood and blood-forming organs and certain disorders involving the immune mechanism: Secondary | ICD-10-CM

## 2023-12-05 DIAGNOSIS — R600 Localized edema: Secondary | ICD-10-CM | POA: Diagnosis not present

## 2023-12-05 DIAGNOSIS — I10 Essential (primary) hypertension: Secondary | ICD-10-CM | POA: Diagnosis not present

## 2023-12-05 DIAGNOSIS — Z13220 Encounter for screening for lipoid disorders: Secondary | ICD-10-CM

## 2023-12-05 MED ORDER — SEMAGLUTIDE-WEIGHT MANAGEMENT 0.25 MG/0.5ML ~~LOC~~ SOAJ: 0.2500 mg | SUBCUTANEOUS | 0 refills | Status: DC

## 2023-12-05 NOTE — Progress Notes (Signed)
 Subjective:  Patient ID: [name], female    DOB: [birth year]  Age: 43 y.o. MRN: [id number]  CC:  Follow up  HPI:  43 year old female presents for follow up.  BP stable on hydrochlorothiazide . Amlodipine  previously discontinued. Still reports intermittent lower extremity edema (feet and ankles). No edema today.   Patient reports some weight gain and is finding it difficult to lose weight. She is working out 4 times a week. Typically eats one meal a day. She would like to discuss things that can aid weight loss today.  Patient Active Problem List   Diagnosis Date Noted   Obesity (BMI 30-39.9) 12/05/2023   Lower extremity edema 12/05/2023   S/P repair of ventral hernia 10/27/2021   Essential hypertension 03/27/2020   Vitamin D  deficiency 07/04/2015    Social Hx   Social History   Socioeconomic History   Marital status: Married    Spouse name: Ozell   Number of children: 12   Years of education: 12   Highest education level: Not on file  Occupational History    Comment: [employer]  Tobacco Use   Smoking status: Former    Current packs/day: 0.00    Average packs/day: 0.3 packs/day for 5.0 years (1.3 ttl pk-yrs)    Types: Cigarettes    Start date: 02/25/2013    Quit date: 02/25/2018    Years since quitting: 5.7   Smokeless tobacco: Never  Vaping Use   Vaping status: Never Used  Substance and Sexual Activity   Alcohol use: Yes    Alcohol/week: 0.0 standard drinks of alcohol    Comment: occ   Drug use: No   Sexual activity: Yes    Birth control/protection: Surgical    Comment: tubal with ablation  Other Topics Concern   Not on file  Social History Narrative   Not on file   Social Drivers of Health   Financial Resource Strain: Low Risk  (07/15/2023)   Overall Financial Resource Strain (CARDIA)    Difficulty of Paying Living Expenses: Not hard at all  Food Insecurity: No Food Insecurity (07/15/2023)   Hunger Vital Sign    Worried About Running Out of Food in  the Last Year: Never true    Ran Out of Food in the Last Year: Never true  Transportation Needs: No Transportation Needs (07/15/2023)   PRAPARE - Administrator, Civil Service (Medical): No    Lack of Transportation (Non-Medical): No  Physical Activity: Insufficiently Active (07/15/2023)   Exercise Vital Sign    Days of Exercise per Week: 2 days    Minutes of Exercise per Session: 30 min  Stress: No Stress Concern Present (07/15/2023)   [employer] of Occupational Health - Occupational Stress Questionnaire    Feeling of Stress : Not at all  Social Connections: Unknown (07/15/2023)   Social Connection and Isolation Panel    Frequency of Communication with Friends and Family: More than three times a week    Frequency of Social Gatherings with Friends and Family: Once a week    Attends Religious Services: Patient declined    Database administrator or Organizations: No    Attends Engineer, structural: Never    Marital Status: Married    Review of Systems Per HPI  Objective:  BP 128/80   Pulse 70   Temp 97.9 F (36.6 C)   Ht 5' 5 (1.651 m)   Wt 212 lb 12.8 oz (96.5 kg)   SpO2 99%  BMI 35.41 kg/m      12/05/2023    1:15 PM 07/22/2023    8:33 AM 07/15/2023   10:58 AM  BP/Weight  Systolic BP 128 107 145  Diastolic BP 80 71 93  Wt. (Lbs) 212.8 205.4   BMI 35.41 kg/m2 34.18 kg/m2     Physical Exam Vitals and nursing note reviewed.  Constitutional:      Appearance: Normal appearance. She is obese.  HENT:     Head: Normocephalic and atraumatic.  Cardiovascular:     Rate and Rhythm: Normal rate and regular rhythm.     Comments: No edema on exam. Pulmonary:     Effort: Pulmonary effort is normal.     Breath sounds: Normal breath sounds. No wheezing, rhonchi or rales.  Neurological:     Mental Status: She is alert.     Lab Results  Component Value Date   WBC 5.8 12/30/2022   HGB 13.2 12/30/2022   HCT 40.3 12/30/2022   PLT 304 12/30/2022    GLUCOSE 80 12/30/2022   CHOL 178 12/30/2022   TRIG 51 12/30/2022   HDL 73 12/30/2022   LDLCALC 95 12/30/2022   ALT 18 12/30/2022   AST 20 12/30/2022   NA 137 12/30/2022   K 4.1 12/30/2022   CL 102 12/30/2022   CREATININE 0.66 12/30/2022   BUN 12 12/30/2022   CO2 24 12/30/2022   TSH 1.560 12/30/2022   HGBA1C 5.3 11/12/2021     Assessment & Plan:  Essential hypertension Assessment & Plan: Stable. Continue hydrochlorothiazide .  Orders: -     CMP14+EGFR  Screening, lipid -     Lipid panel  Weight gain -     TSH  Screening for deficiency anemia -     CBC  Obesity (BMI 30-39.9) Assessment & Plan: Discussed nutrition, calorie deficit, medications. Will see if insurance covers Wegovy . Continue regular exercise. Referring to Nutrition.  Orders: -     Amb ref to Medical Nutrition Therapy-MNT -     Semaglutide -Weight Management; Inject 0.25 mg into the skin once a week.  Dispense: 2 mL; Refill: 0  Lower extremity edema Assessment & Plan: Currently improved. Advised healthy diet. Continued exercise. Elevation.     Follow-up:  3-6 months  Artelia Game Bluford DO [facility]

## 2023-12-05 NOTE — Assessment & Plan Note (Signed)
Stable Continue hydrochlorothiazide 

## 2023-12-05 NOTE — Assessment & Plan Note (Addendum)
 Currently improved. Advised healthy diet. Continued exercise. Elevation.

## 2023-12-05 NOTE — Assessment & Plan Note (Signed)
 Discussed nutrition, calorie deficit, medications. Will see if insurance covers Wegovy . Continue regular exercise. Referring to Nutrition.

## 2023-12-05 NOTE — Patient Instructions (Signed)
 Labs ordered.  Referral placed.  Medication sent in.  Follow up in 3-6 months.

## 2023-12-06 ENCOUNTER — Ambulatory Visit: Payer: Self-pay | Admitting: Family Medicine

## 2023-12-06 ENCOUNTER — Encounter (HOSPITAL_COMMUNITY): Payer: Self-pay | Admitting: Pharmacy Technician

## 2023-12-06 ENCOUNTER — Encounter: Payer: Self-pay | Admitting: Pharmacy Technician

## 2023-12-06 ENCOUNTER — Other Ambulatory Visit (HOSPITAL_COMMUNITY): Payer: Self-pay

## 2023-12-06 ENCOUNTER — Telehealth: Payer: Self-pay | Admitting: Pharmacy Technician

## 2023-12-06 LAB — CMP14+EGFR
ALT: 23 IU/L (ref 0–32)
AST: 19 IU/L (ref 0–40)
Albumin: 4.3 g/dL (ref 3.9–4.9)
Alkaline Phosphatase: 83 IU/L (ref 44–121)
BUN/Creatinine Ratio: 16 (ref 9–23)
BUN: 14 mg/dL (ref 6–24)
Bilirubin Total: 0.5 mg/dL (ref 0.0–1.2)
CO2: 18 mmol/L — ABNORMAL LOW (ref 20–29)
Calcium: 9.6 mg/dL (ref 8.7–10.2)
Chloride: 102 mmol/L (ref 96–106)
Creatinine, Ser: 0.89 mg/dL (ref 0.57–1.00)
Globulin, Total: 3.2 g/dL (ref 1.5–4.5)
Glucose: 89 mg/dL (ref 70–99)
Potassium: 4.8 mmol/L (ref 3.5–5.2)
Sodium: 140 mmol/L (ref 134–144)
Total Protein: 7.5 g/dL (ref 6.0–8.5)
eGFR: 83 mL/min/1.73 (ref >59)

## 2023-12-06 LAB — CBC
Hematocrit: 43.2 % (ref 34.0–46.6)
Hemoglobin: 14.1 g/dL (ref 11.1–15.9)
MCH: 29.7 pg (ref 26.6–33.0)
MCHC: 32.6 g/dL (ref 31.5–35.7)
MCV: 91 fL (ref 79–97)
Platelets: 359 x10E3/uL (ref 150–450)
RBC: 4.74 x10E6/uL (ref 3.77–5.28)
RDW: 13.1 % (ref 11.7–15.4)
WBC: 5.5 x10E3/uL (ref 3.4–10.8)

## 2023-12-06 LAB — LIPID PANEL
Chol/HDL Ratio: 3 ratio (ref 0.0–4.4)
Cholesterol, Total: 196 mg/dL (ref 100–199)
HDL: 65 mg/dL (ref >39)
LDL Chol Calc (NIH): 117 mg/dL — ABNORMAL HIGH (ref 0–99)
Triglycerides: 78 mg/dL (ref 0–149)
VLDL Cholesterol Cal: 14 mg/dL (ref 5–40)

## 2023-12-06 LAB — TSH: TSH: 0.912 u[IU]/mL (ref 0.450–4.500)

## 2023-12-06 NOTE — Telephone Encounter (Signed)
 ERROR

## 2023-12-06 NOTE — Telephone Encounter (Signed)
 Pharmacy Patient Advocate Encounter   Received notification from CoverMyMeds that prior authorization for Wegovy  0.25MG /0.5ML auto-injectors is required/requested.   Insurance verification completed.   The patient is insured through [employer] [place]  .   Per test claim: PA required; PA submitted to above mentioned insurance via LATENT Key/confirmation #/EOC [id number] Status is pending

## 2023-12-06 NOTE — Telephone Encounter (Signed)
 error

## 2023-12-06 NOTE — Telephone Encounter (Signed)
 Pharmacy Patient Advocate Encounter  Received notification from [employer] [place]  that Prior Authorization for Wegovy  0.25MG /0.5ML auto-injectors  has been APPROVED from 11/06/2023 to 12/05/2024. Ran test claim, Copay is $382.05. This test claim was processed through [facility]- copay amounts may vary at other pharmacies due to pharmacy/plan contracts, or as the patient moves through the different stages of their insurance plan.   PA #/Case ID/Reference #: [id number]  Copay applied to the deductible of $150.00.

## 2024-01-05 ENCOUNTER — Other Ambulatory Visit: Payer: Self-pay | Admitting: Family Medicine

## 2024-01-05 DIAGNOSIS — I10 Essential (primary) hypertension: Secondary | ICD-10-CM

## 2024-01-22 ENCOUNTER — Encounter: Payer: Self-pay | Admitting: Family Medicine

## 2024-01-23 ENCOUNTER — Other Ambulatory Visit: Payer: Self-pay | Admitting: Family Medicine

## 2024-01-23 MED ORDER — SEMAGLUTIDE-WEIGHT MANAGEMENT 0.5 MG/0.5ML ~~LOC~~ SOAJ: 0.5000 mg | SUBCUTANEOUS | 1 refills | Status: DC

## 2024-02-01 ENCOUNTER — Other Ambulatory Visit: Payer: Self-pay | Admitting: Family Medicine

## 2024-02-01 DIAGNOSIS — E669 Obesity, unspecified: Secondary | ICD-10-CM

## 2024-02-03 IMAGING — MR MR HEEL *R* W/O CM
5 series · 38 of 40 positions shown · non-contrast
Comparison: Right foot radiographs 10/21/2020

CLINICAL DATA: Plantar fasciitis.  Heel pain for 6 months.

EXAM:
MR OF THE RIGHT HEEL WITHOUT CONTRAST
TECHNIQUE: Multiplanar, multisequence MR imaging of the right heel was
performed. No intravenous contrast was administered.

[Series 5: T2 fat-sat · axial · 3.5mm · 0.53mm/px · z∈[-132,+18]mm · 8 of 34 slices shown (1 of 2)]
[im 1/34]
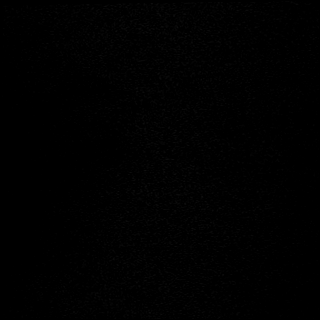
[im 4/34]
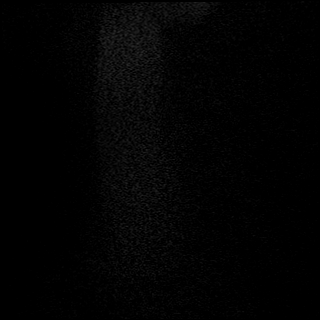
[im 12/34]
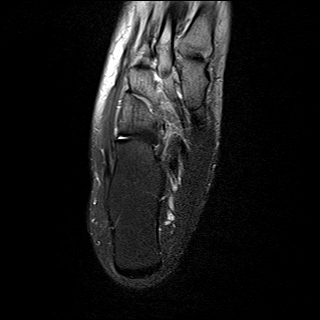
[im 15/34]
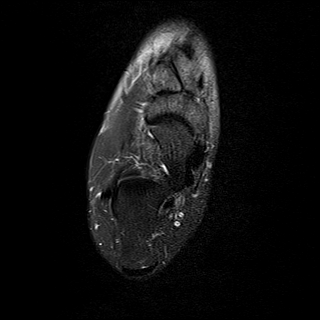
[im 19/34]
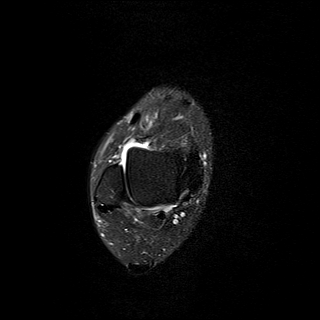
[im 23/34]
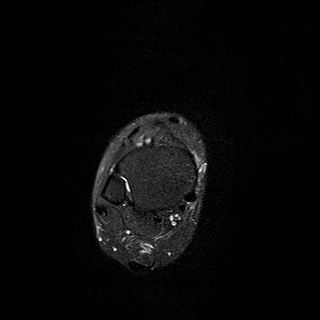
[im 30/34]
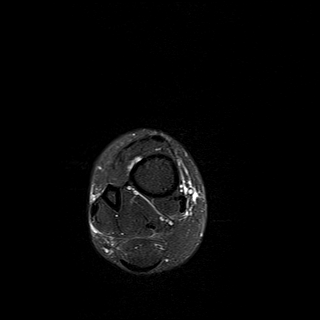
[im 34/34]
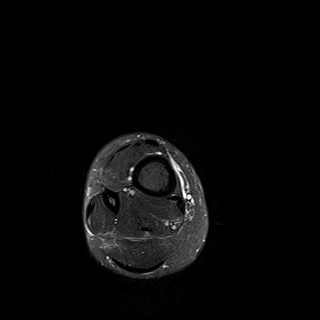

[Series 6: PD fat-sat · axial · 3.5mm · 0.53mm/px · z∈[-132,+18]mm · 9 of 34 slices shown]
[im 1/34]
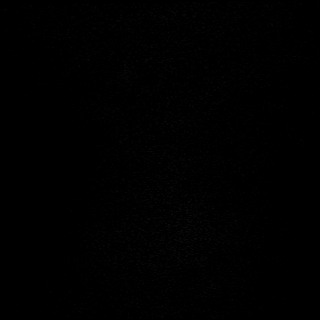
[im 5/34]
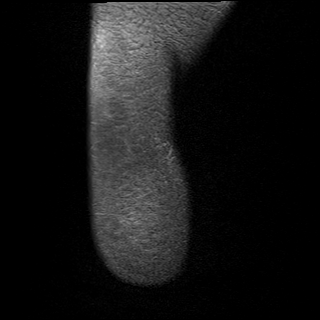
[im 9/34]
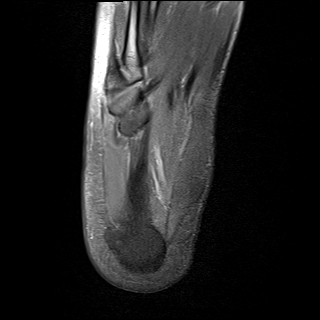
[im 13/34]
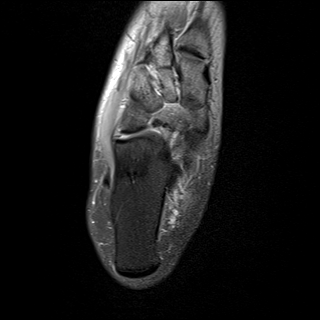
[im 17/34]
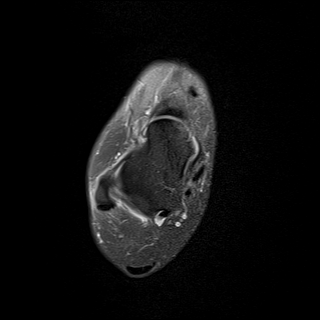
[im 21/34]
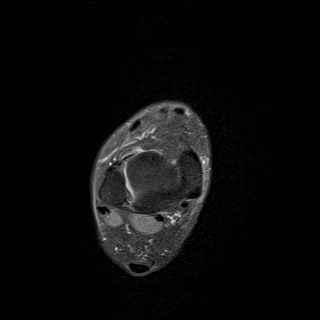
[im 25/34]
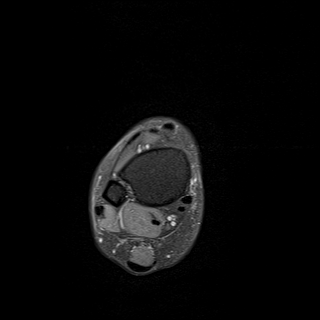
[im 29/34]
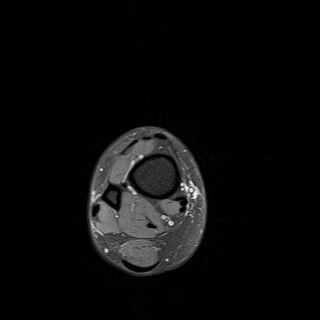
[im 34/34]
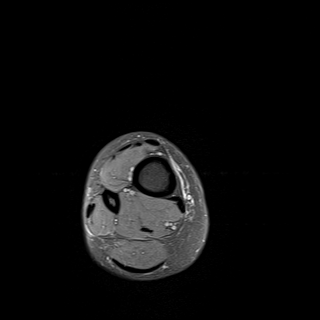

[Series 7: T1 · sagittal · 4.0mm · 0.56mm/px · 6 of 24 slices shown]
[im 1/24]
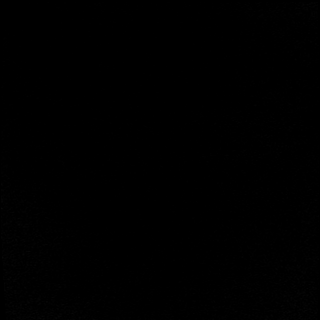
[im 5/24]
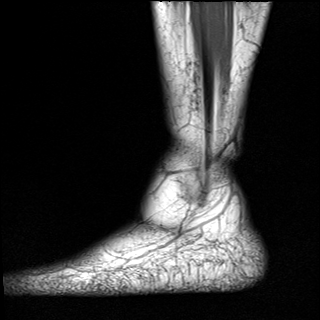
[im 10/24]
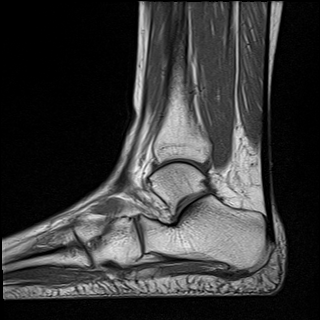
[im 14/24]
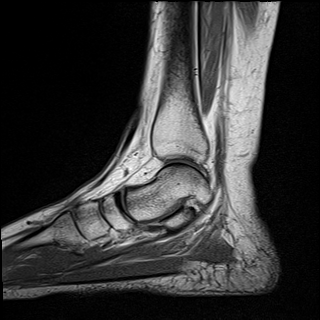
[im 19/24]
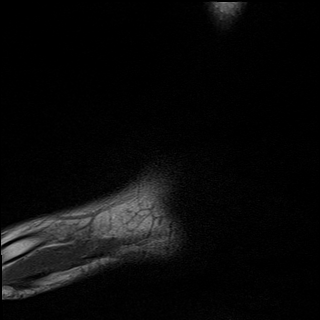
[im 24/24]
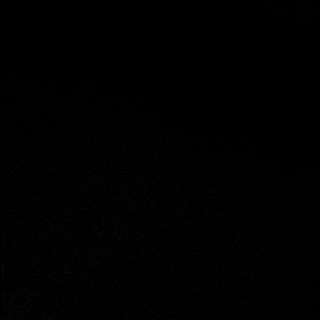

[Series 8: STIR · sagittal · 4.0mm · 0.35mm/px · 6 of 24 slices shown]
[im 1/24]
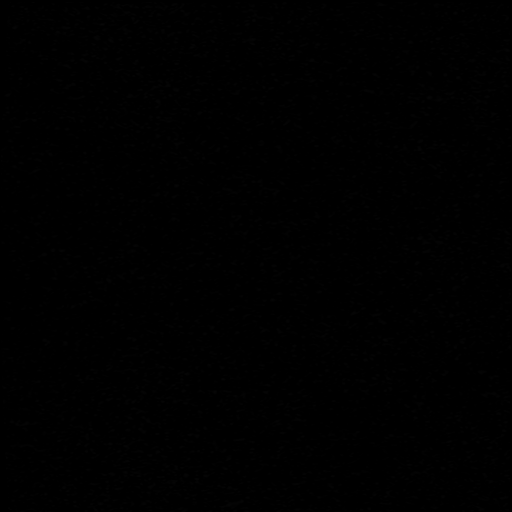
[im 5/24]
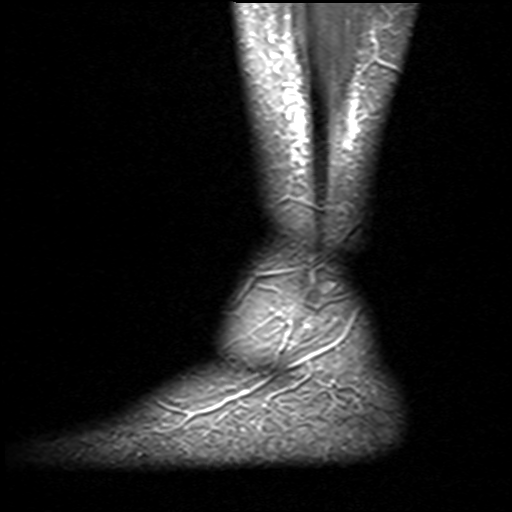
[im 10/24]
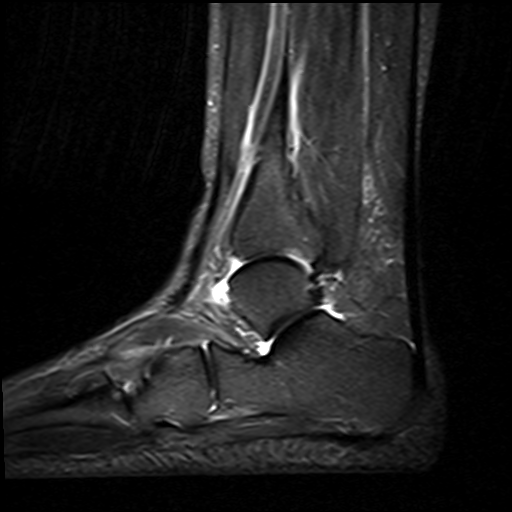
[im 14/24]
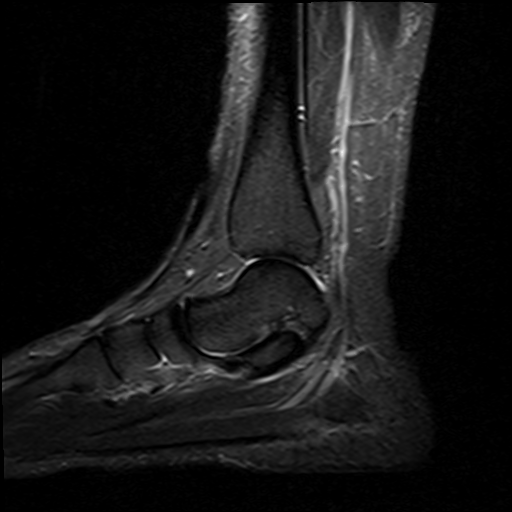
[im 19/24]
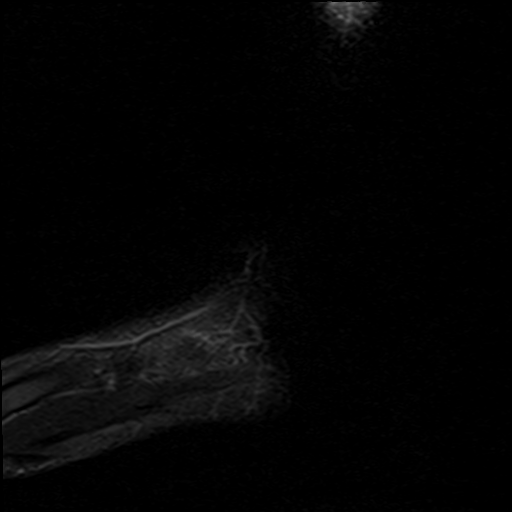
[im 24/24]
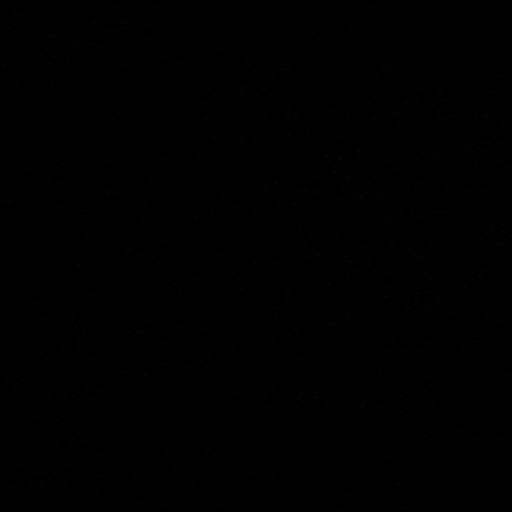

[Series 9: T2 fat-sat · coronal · 3.5mm · 0.53mm/px · 9 of 35 slices shown (2 of 2)]
[im 1/35]
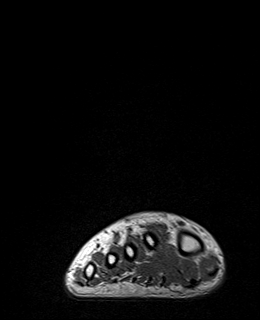
[im 5/35]
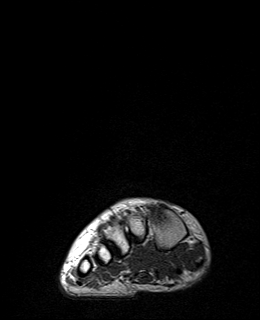
[im 9/35]
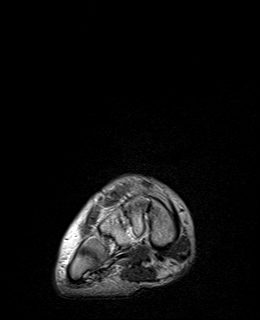
[im 13/35]
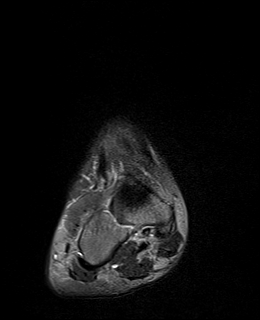
[im 18/35]
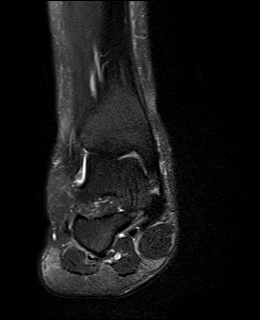
[im 22/35]
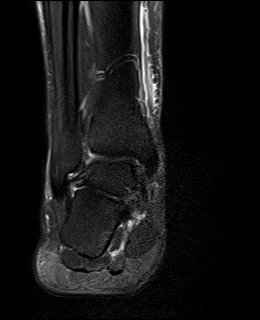
[im 26/35]
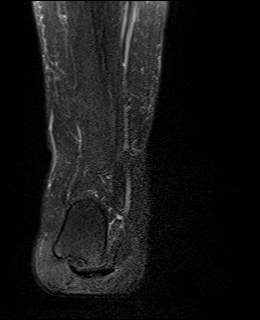
[im 30/35]
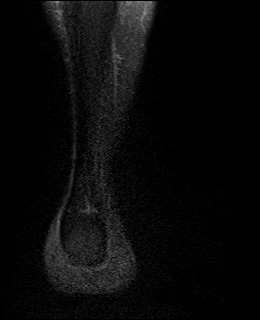
[im 35/35]
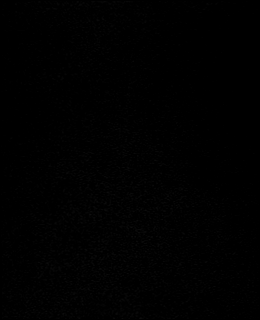

[38 of 40 positions shown; findings below may reference images not displayed]

FINDINGS: TENDONS

Peroneal: There is a mild "Renu configuration" of the peroneus
brevis tendon just distal to the fibula (axial image 21), a likely
short-segment partial-thickness posterior tear. The peroneus longus
tendon is intact.

Posteromedial: Intact tibialis posterior, flexor digitorum longus,
and flexor hallucis longus tendons.

Anterior: The tibialis anterior, extensor hallucis longus and
extensor digitorum longus tendons are intact.

Achilles: Intact.

Plantar Fascia: Small calcaneal heel spur. Mild edema within the
adjacent calcaneal marrow. Otherwise, no abnormal signal is seen
within the tendons of the plantar fascia origin.

LIGAMENTS

Lateral: The anterior and posterior talofibular, anterior and
posterior tibiofibular, and calcaneofibular ligaments are intact.

Medial: The tibiotalar deep deltoid and tibial spring ligaments are
intact.

CARTILAGE

Ankle Joint: Intact cartilage.

Subtalar Joints/Sinus Tarsi: Fat is preserved within sinus tarsi.

Bones: Normal marrow signal. No acute fracture.

Other: The tarsal tunnel is unremarkable. The Lisfranc ligament
complex is intact.
IMPRESSION: :
IMPRESSION: 1. Small calcaneal heel spur with mild associated marrow edema.
Otherwise, no abnormal signal is seen within the plantar fascia
origin to indicate plantar fasciitis.
2. Short segment partial-thickness tear of the posterior peroneus
brevis tendon just distal to the fibula.

## 2024-04-27 ENCOUNTER — Encounter: Payer: Self-pay | Admitting: Family Medicine

## 2024-04-27 ENCOUNTER — Other Ambulatory Visit: Payer: Self-pay

## 2024-04-28 ENCOUNTER — Other Ambulatory Visit: Payer: Self-pay | Admitting: Family Medicine

## 2024-04-28 MED ORDER — SEMAGLUTIDE-WEIGHT MANAGEMENT 0.5 MG/0.5ML ~~LOC~~ SOAJ: 0.5000 mg | SUBCUTANEOUS | 3 refills | Status: DC

## 2024-05-01 ENCOUNTER — Other Ambulatory Visit: Payer: Self-pay | Admitting: Adult Health

## 2024-05-07 ENCOUNTER — Telehealth: Payer: Self-pay | Admitting: Pharmacy Technician

## 2024-05-07 ENCOUNTER — Other Ambulatory Visit (HOSPITAL_COMMUNITY): Payer: Self-pay

## 2024-05-07 NOTE — Telephone Encounter (Signed)
 Pharmacy Patient Advocate Encounter   Received notification from [employer] that prior authorization for Wegovy  0.5mg  is required/requested.   Insurance verification completed.   The patient is insured through [employer].   Per test claim: PA required; PA submitted to above mentioned insurance via Latent Key/confirmation #/EOC [id number] Status is pending

## 2024-05-07 NOTE — Telephone Encounter (Addendum)
 Pharmacy Patient Advocate Encounter  Received notification from [employer] that Prior Authorization for Wegovy  0.5mg /0.25ml has been APPROVED from 04/07/24 to 05/07/25   PA #/Case ID/Reference #: [id number]  Approval letter indexed to media tab

## 2024-05-09 ENCOUNTER — Other Ambulatory Visit: Payer: Self-pay | Admitting: Family Medicine

## 2024-05-15 ENCOUNTER — Other Ambulatory Visit (HOSPITAL_COMMUNITY): Payer: Self-pay

## 2024-05-15 ENCOUNTER — Telehealth: Payer: Self-pay | Admitting: Pharmacy Technician

## 2024-05-15 NOTE — Telephone Encounter (Signed)
 Pharmacy Patient Advocate Encounter   Received notification from [facility] [employer] that prior authorization for Wegovy  05mg  is required/requested.   Insurance verification completed.   The patient is insured through [employer].   Per test claim: Pt can only get 4 ml filled in a 135 day time frame. It appears her ins would like her to go up to the 1mg . Even though we just got the approval for the 0.5mg , it said nothing about a quantity limit on the approval letter. The 1mg  will go through without another PA.  Thanks.

## 2024-05-16 ENCOUNTER — Other Ambulatory Visit: Payer: Self-pay | Admitting: Family Medicine

## 2024-05-16 MED ORDER — WEGOVY 1 MG/0.5ML ~~LOC~~ SOAJ: 1.0000 mg | SUBCUTANEOUS | 1 refills | Status: AC

## 2024-07-05 ENCOUNTER — Ambulatory Visit: Admitting: Family Medicine

## 2024-08-24 ENCOUNTER — Ambulatory Visit: Admitting: Adult Health
